# Patient Record
Sex: Female | Born: 1955 | Race: White | Hispanic: No | Marital: Married | Smoking: Former smoker
Health system: Southern US, Community
[De-identification: ages and names within clinical notes are randomized; demographics above are authoritative.]

## PROBLEM LIST (undated history)

## (undated) DIAGNOSIS — I219 Acute myocardial infarction, unspecified: Secondary | ICD-10-CM

## (undated) DIAGNOSIS — Z9889 Other specified postprocedural states: Secondary | ICD-10-CM

## (undated) DIAGNOSIS — I10 Essential (primary) hypertension: Secondary | ICD-10-CM

## (undated) DIAGNOSIS — I5181 Takotsubo syndrome: Secondary | ICD-10-CM

## (undated) DIAGNOSIS — R112 Nausea with vomiting, unspecified: Secondary | ICD-10-CM

## (undated) HISTORY — PX: UTERINE FIBROID SURGERY: SHX826

## (undated) HISTORY — DX: Essential (primary) hypertension: I10

## (undated) HISTORY — DX: Takotsubo syndrome: I51.81

## (undated) HISTORY — DX: Acute myocardial infarction, unspecified: I21.9

---

## 1997-09-14 HISTORY — PX: ABDOMINAL HYSTERECTOMY: SHX81

## 1998-11-12 ENCOUNTER — Other Ambulatory Visit: Admission: RE | Admit: 1998-11-12 | Discharge: 1998-11-12 | Payer: Self-pay | Admitting: Obstetrics and Gynecology

## 2005-05-01 ENCOUNTER — Inpatient Hospital Stay (HOSPITAL_COMMUNITY): Admission: EM | Admit: 2005-05-01 | Discharge: 2005-05-05 | Payer: Self-pay | Admitting: Emergency Medicine

## 2005-05-01 ENCOUNTER — Ambulatory Visit: Payer: Self-pay | Admitting: Cardiology

## 2005-05-04 ENCOUNTER — Encounter: Payer: Self-pay | Admitting: Cardiology

## 2005-06-05 ENCOUNTER — Ambulatory Visit: Payer: Self-pay | Admitting: Cardiology

## 2005-06-30 ENCOUNTER — Ambulatory Visit: Payer: Self-pay | Admitting: Cardiology

## 2005-08-17 ENCOUNTER — Ambulatory Visit: Payer: Self-pay | Admitting: Cardiology

## 2005-09-11 ENCOUNTER — Ambulatory Visit: Payer: Self-pay

## 2005-09-11 ENCOUNTER — Encounter: Payer: Self-pay | Admitting: Cardiology

## 2006-03-19 ENCOUNTER — Ambulatory Visit: Payer: Self-pay | Admitting: Cardiology

## 2006-06-28 ENCOUNTER — Ambulatory Visit: Payer: Self-pay | Admitting: Cardiology

## 2006-07-11 IMAGING — CR DG CHEST 1V PORT
1 series · 1 of 1 positions shown · non-contrast
Comparison: None.

CLINICAL DATA: Chest pain. Hypertension. Ex-smoker.

PORTABLE CHEST - 1 VIEW

[view not recorded]
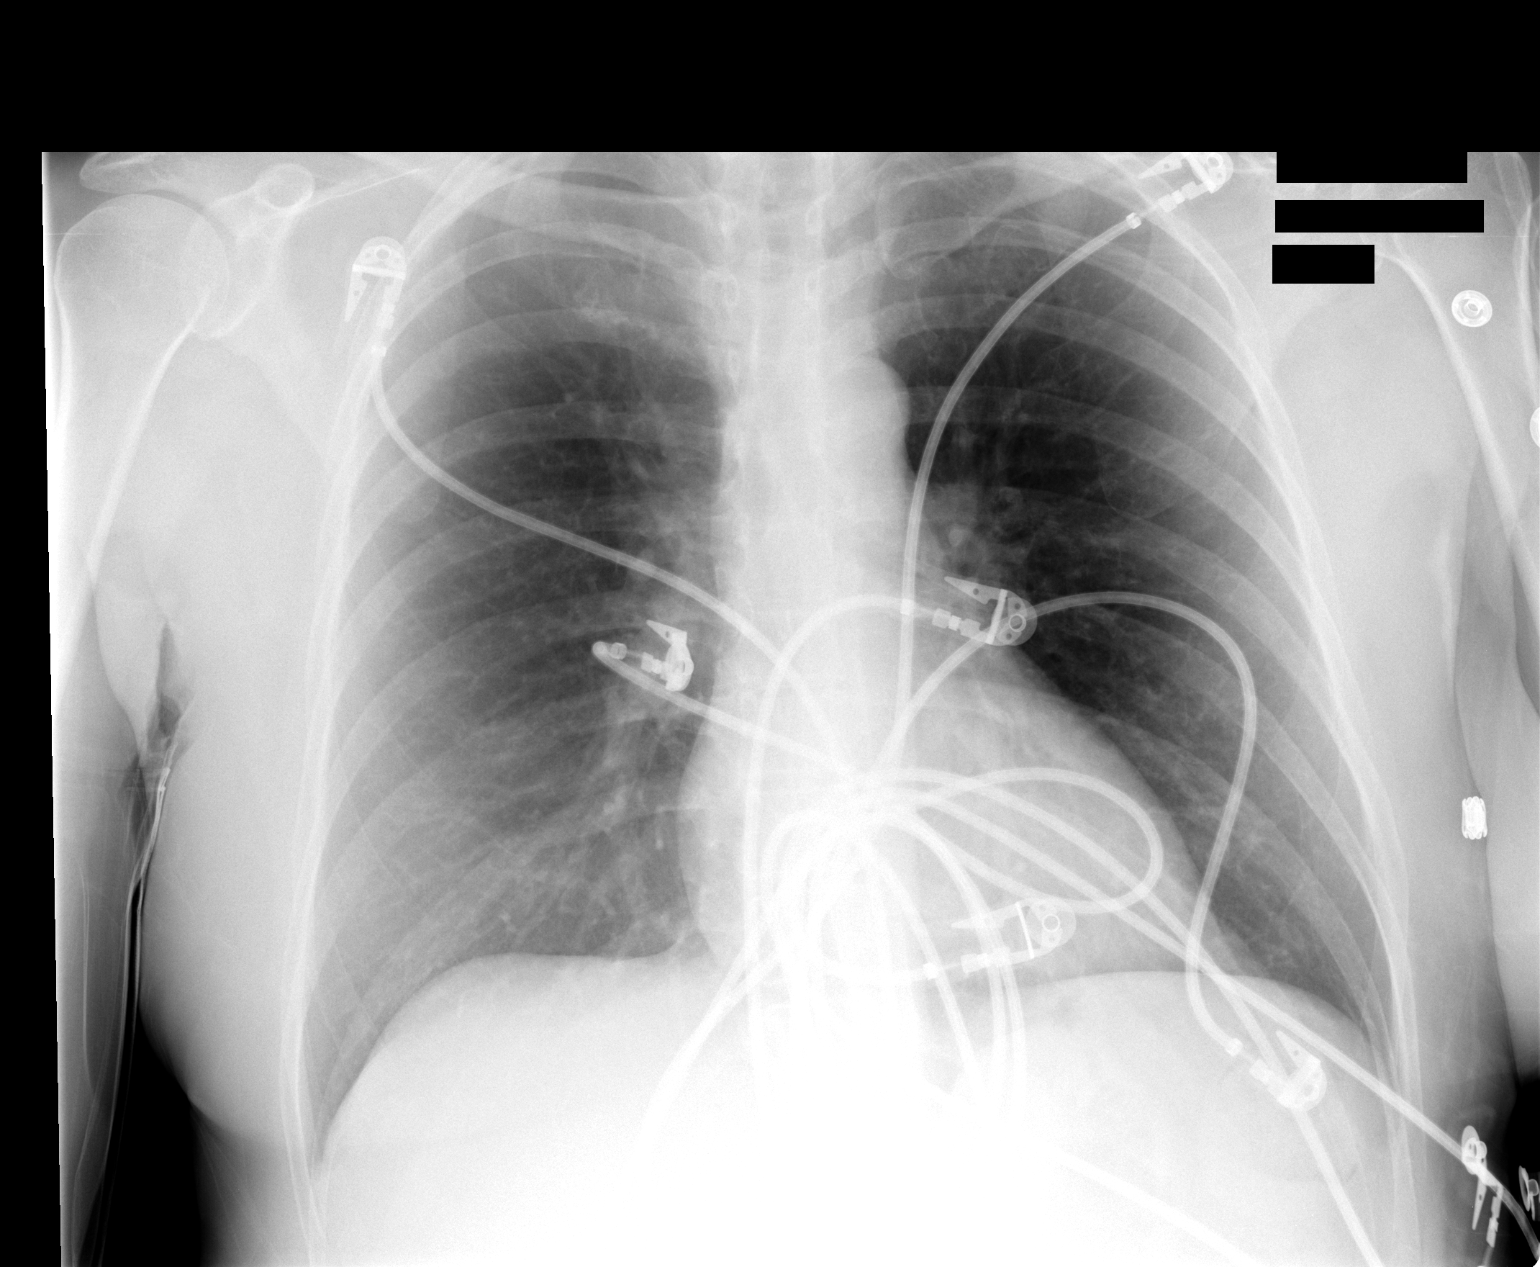

[1 of 1 positions shown; findings below may reference images not displayed]

FINDINGS: Normal sized heart. No changes of COPD. Clear lungs. Unremarkable
bones.  

IMPRESSION

Bile changes of COPD. No acute abnormality.

## 2006-10-25 ENCOUNTER — Ambulatory Visit: Payer: Self-pay | Admitting: Cardiology

## 2006-10-25 LAB — CONVERTED CEMR LAB
AST: 22 units/L (ref 0–37)
Albumin: 4 g/dL (ref 3.5–5.2)
LDL Cholesterol: 104 mg/dL — ABNORMAL HIGH (ref 0–99)
Total Bilirubin: 0.6 mg/dL (ref 0.3–1.2)
Total Protein: 6.6 g/dL (ref 6.0–8.3)

## 2006-11-04 ENCOUNTER — Ambulatory Visit: Payer: Self-pay | Admitting: Cardiology

## 2007-11-04 ENCOUNTER — Ambulatory Visit: Payer: Self-pay | Admitting: Cardiology

## 2008-01-11 ENCOUNTER — Other Ambulatory Visit: Admission: RE | Admit: 2008-01-11 | Discharge: 2008-01-11 | Payer: Self-pay | Admitting: Family Medicine

## 2008-11-09 ENCOUNTER — Ambulatory Visit: Payer: Self-pay | Admitting: Cardiology

## 2008-11-16 ENCOUNTER — Ambulatory Visit: Payer: Self-pay

## 2008-11-16 ENCOUNTER — Encounter: Payer: Self-pay | Admitting: Cardiology

## 2009-02-04 ENCOUNTER — Encounter: Payer: Self-pay | Admitting: Cardiology

## 2009-10-11 ENCOUNTER — Encounter (INDEPENDENT_AMBULATORY_CARE_PROVIDER_SITE_OTHER): Payer: Self-pay | Admitting: *Deleted

## 2010-10-14 NOTE — Letter (Signed)
Summary: Appointment - Reminder 2  [employer], Main Office  [address]   [place], [place] [address]   Phone: [phone]  Fax: [phone]     October 11, 2009 MRN: [id number]   Janice [name] [address] [place], [place]  [address]   Dear Ms. Weidinger,  Our records indicate that it is time to schedule a follow-up appointment with Dr. Myrtis Ser. It is very important that we reach you to schedule this appointment. We look forward to participating in your health care needs. Please contact us at the number listed above at your earliest convenience to schedule your appointment.  If you are unable to make an appointment at this time, give [place] a call so we can update our records.  Sincerely,   Migdalia Dk [facility] Scheduling Team

## 2011-01-27 NOTE — Assessment & Plan Note (Signed)
[facility]                            CARDIOLOGY OFFICE NOTE   NAME:Hennigan, JOSELLE DEEDS                     MRN:          [id number]  DATE:11/04/2007                            DOB:          November 21, 1955    Ms. Vetsch is doing very well.  The patient had an episode of with  chest pain and Tako-Tsubo cardiomyopathy.  Cath showed slight disease in  a very small right coronary branch.  Her LV function returned completely  to normal.  I stopped her Coreg on that basis.  She remains on  lisinopril.  She is doing very well.  She has improved her lifestyle and  has lost 20 pounds.  She is going about full activities.  She is not  having any chest pain.   PAST MEDICAL HISTORY:   ALLERGIES:  No known drug allergies.   MEDICATIONS:  Multivitamin, lisinopril, Premarin, fish oil, vitamin C.   OTHER MEDICAL PROBLEMS:  See the list below.   REVIEW OF SYSTEMS:  She is doing well and a review of systems is  negative.   PHYSICAL EXAM:  Weight is 150 pounds on our scale.  Blood pressure is  130/66 with a pulse of 68.  The patient is oriented to person, time and place.  Affect is normal.  HEENT:  Reveals no xanthelasma.  She has normal extraocular motion.  Lungs are clear.  Respiratory effort is not labored.  Cardiac exam reveals S1-S2.  There are no clicks or significant murmurs.  Abdomen is soft.  She has no peripheral edema.   EKG shows mild left axis.  There are no marked abnormalities.   PROBLEMS:  1. Trivial coronary disease.  She does not tolerate taking a statin.      We will not push towards statin therapy but her lifestyle      modifications have been excellent.  2. Hypertension excellent control next episode of Tako-Tsubo      cardiomyopathy from which she has improved completely.  3. History of a fractured left ankle.  4. History of significant constipation historically.   She is doing well.  No change in her meds.  I will see her back in the  year.     Luis Abed, MD, [facility]  Electronically Signed    JDK/MedQ  DD: 11/04/2007  DT: 11/05/2007  Job #: [id number]   cc:   Deboraha Sprang at [facility]

## 2011-01-27 NOTE — Assessment & Plan Note (Signed)
[facility]                            CARDIOLOGY OFFICE NOTE   NAME:Janice [name], Janice [name]                     MRN:          [id number]  DATE:11/09/2008                            DOB:          [birth year]    Janice [name] is quite stable.  Several years ago, she had a takotsubo  event.  She developed left ventricular dysfunction, which immediately  improved to normal.  Cardiac catheterization was done at that time.  She  had very slight disease and a small right coronary artery.  After her LV  returned completely to normal, her meds were stopped.  She had been on  lisinopril and carvedilol.  She is no longer on these.  She has no  significant symptoms.   PAST MEDICAL HISTORY:   ALLERGIES:  No known drug allergies.   MEDICATIONS:  Multivitamin, fish oil, vitamin C, Premarin, and  amlodipine.   OTHER MEDICAL PROBLEMS:  See the list below.   REVIEW OF SYSTEMS:  She has no GI or GU complaints.  There are no fevers  or chills.  She has no skin rashes.  Her review of systems is negative.   PHYSICAL EXAMINATION:  VITAL SIGNS:  Blood pressure is 137/74 with pulse  of 75.  GENERAL:  The patient is oriented to person, time, and place.  Affect is  normal.  HEENT:  No xanthelasma.  She has normal extraocular motion.  There are  no carotid bruits.  There is no jugular venous distention.  LUNGS:  Clear.  Respiratory effort is not labored.  CARDIAC:  An S1-S2.  There are no clicks or significant murmurs.  ABDOMEN:  Soft.  She has no peripheral edema.   EKG shows no change.  There is old left axis.   PROBLEMS:  1. 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  2. Hypertension, controlled.  3. Episode of takotsubo cardiomyopathy with complete resolution and      return to normal left ventricular function.   The patient is quite stable.  I have decided to proceed with the  followup 2-D echo to be sure that her LV function has remained normal.  This will be done and will be in touch with her.     Luis Abed, MD, [facility]  Electronically Signed    JDK/MedQ  DD: 11/09/2008  DT: 11/10/2008  Job #: [id number]   cc:   Dossie Der, MD

## 2011-01-30 NOTE — Discharge Summary (Signed)
Janice [name], Janice [name] NO.:  [id number]   MEDICAL RECORD NO.:  [id number]          PATIENT TYPE:  INP   LOCATION:  4709                         FACILITY:  [facility]   PHYSICIAN:  Arvilla Meres, M.D. LHCDATE OF BIRTH:  Oct 27, 1955   DATE OF ADMISSION:  05/01/2005  DATE OF DISCHARGE:  05/05/2005                           DISCHARGE SUMMARY - REFERRING   SUMMARY OF HISTORY:  Janice [name] is a 55 year old white female who on  April 30, 2005 at approximately 2 p.m., after getting her hair cut, stood  up out of a chair and developed sudden onset of 8/10 substernal chest  heaviness radiating through to her upper mid-back associated with  diaphoresis.  On return home, she took an aspirin with some relief; however,  at 11 p.m., it increased again.  She took another aspirin and was eventually  able to fall asleep.  At approximately 5 a.m., she was awakened again with  similar symptoms and thus presented to the emergency room.  She was given 4  sublingual nitroglycerin as well as aspirin with complete relief of her  discomfort.  Enzymes were positive for myocardial infarction and EKG showed  nonspecific changes, thus she was admitted for further evaluation.   Her history is notable for hypertension, unknown lipid status, remote  tobacco use.   LABORATORY DATA:  Chest x-ray shows no acute abnormality.   Hemoglobin and hematocrit were 14.6 and 42.1, normal indices, platelets of  334,000, WBC was 12.5 on admission.  Subsequent hematologies were  unremarkable.  Admission PTT was greater than 200, PT 14.4 with an INR of  1.1.  Admission sodium was 136, potassium 3.0, BUN 11, creatinine 0.7,  glucose 129.  Chemistry on the 19th showed a potassium of 3.1, normal LFTs.  Potassium on the 20th was 4.1.  Last chemistry performed on the 21st was 141  and showed a sodium of 141, potassium 3.6, BUN 7, creatinine 0.7.  Initial  CK total/MB was 366 and 23.5 with a 6.4 relative index.   Troponin was 4.75.  Subsequent CK-MB relative index and troponin were declining.  Fasting lipids  showed a total cholesterol of 152, triglycerides of 222, HDL 37, LDL 71.  TSH was 4.084.   Echocardiogram performed on May 04, 2005 showed an ejection fraction of  50% to 55% without wall motion abnormalities.  There was flat coaptation of  the mitral valve without definite mitral valve prolapse.   EKG on admission showed normal sinus rhythm, left axis deviation, slightly  delayed R wave, T wave inversion in V5 and V6.  Subsequent EKGs showed  normal sinus rhythm, diffuse T wave inversion.   HOSPITAL COURSE:  Janice [name] went directly to the cardiac catheterization  laboratory.  Dr. Samule Ohm performed cardiac catheterization.  Initial EF was  35% with apical ballooning.  She did not have any coronary artery disease.  Dr. Samule Ohm felt that she had Takotsubo's cardiomyopathy and we will treat  with ACE inhibitor, Coreg and anticoagulate.  There was noticed to be a tiny  branch of the RCA which appeared to be full, filled via left-to-right  collaterals; this  is not, however, large enough to explain the LV  dysfunction, thus he favored the diagnosis of Takotsubo cardiomyopathy.  She  continued on heparin and over the next several days, her medications were  gradually adjusted.  Lipitor was started for her hyperlipidemia.  Dr.  Diona Browner initially felt that she should not be on Premarin.  Cardiac Rehab  assisted with ambulation and education.  She was transferred to Telemetry,  where her activity increased without difficulty.  Discharge planning was  begun.  Case Management also assisted with education and discharge needs.  It was noted that she had some diarrhea on the 22nd; it was treated with  Zofran and Imodium.  On May 05, 2005, after Dr. Gala Romney assessed the  patient, he had reviewed the echocardiogram and it was noted to show an EF  of approximately 50% to 55%.  He felt that she  could be discharged home on  her current medical regimen and follow up with Dr. Myrtis Ser in approximately 3-4  weeks.  If she continued to have diarrhea, she was asked to contact her  primary care physician.   DISCHARGE DIAGNOSES:  1.  Non-Q myocardial infarction.  2.  Takotsubo cardiomyopathy with improved left ventricular function prior      to discharge.  3.  Hypokalemia.  4.  Hyperlipidemia.  5.  Hypertension.  6.  History as previously.   DISPOSITION:  Janice [name] was discharged home.   DIET:  She was asked to maintain a low-salt/-fat/-cholesterol diet.   ACTIVITY:  Her activities were restricted for approximately 2 weeks in  regard to driving, lifting, sexual activity.  She was advised to increase  her activity slowly and if she is feeling up to it, may return to work at  the end of 2 weeks.   MEDICATIONS:  1.  Premarin 0.625 mg daily (Dr. Myrtis Ser instructed at the time of discharge      she may continue this).  2.  Aspirin 325 mg daily.  3.  Lisinopril 5 mg daily.  4.  Plavix 75 mg daily.  5.  Coreg 6.25 mg b.i.d.  6.  Lipitor 40 mg nightly.  7.  Nitroglycerin 0.4 mg as needed.   FOLLOWUP:  She will follow up with Dr. Henrietta Hoover physician assistant, Joellyn Rued, PA-C, on a day in the office that Dr. Myrtis Ser is there.  This is  scheduled for June 01, 2005 at 10 a.m.  She is also asked to arrange a  followup with Dr. Janey Greaser for her general medical needs and especially if  her diarrhea persists.  She was instructed not to take her atenolol or  chlorthalidone.  At the time of followup with Dr. Myrtis Ser, blood work in  regards to fasting lipids  and LFTs should be arranged in approximately 6-8 weeks since Lipitor was  initiated.  Given her diagnosis of Takotsubo cardiomyopathy, an  echocardiogram should also be performed in approximately 3 months to further  evaluate her ejection fraction.      Joellyn Rued, P.A. [facility]      Arvilla Meres, M.D. [facility] Electronically  Signed    EW/MEDQ  D:  05/05/2005  T:  05/05/2005  Job:  [id number]   cc:   Willa Rough, M.D.  [address]  [address]  [place]  [place] [address]   Al Decant. Janey Greaser, MD  [address]  [place]  [place] [address]  Fax: [phone]

## 2011-01-30 NOTE — Assessment & Plan Note (Signed)
[facility]                            CARDIOLOGY OFFICE NOTE   NAME:[name], Janice MAIORINO                     MRN:          [id number]  DATE:01/12/2008                            DOB:          1956/07/24    I received a phone call today from Dr. Camillo Flaming concerning [name].  The patient had said that she could never receive any kind of  epinephrine, and Dr. Zachery Dauer was asking about this.  I explained that we  would like for this to be limited to force on any elective basis such as  dental work.  This is because she has had a stress cardiomyopathy.  However, certainly if she were to have anaphylaxis and need epinephrine,  that would be appropriate.  Dr. Zachery Dauer also asked me about blood  pressure medicines.  I told her that any standard blood pressure  medicine could be used.  We may want to keep away from beta blockers.     Luis Abed, MD, [facility]  Electronically Signed    JDK/MedQ  DD: 01/12/2008  DT: 01/12/2008  Job #: [id number]

## 2011-01-30 NOTE — Assessment & Plan Note (Signed)
[facility]                              CARDIOLOGY OFFICE NOTE   NAME:[name], Janice ROESLER                     MRN:          [id number]  DATE:06/28/2006                            DOB:          [birth year]    Janice [name] is seen for followup.  She is doing very well.  She had non-ST  elevation MI that was probably due to Takat Subo cardiomyopathy.  She did  have slight disease of a very small right coronary artery branch.  She had  very rapid return of LV function.  She had a follow-up echo on September 11, 2005 showing that her ejection fraction was up to 55-65% with no regional  wall motion abnormalities.  She is not having any chest pain.  She has no  significant shortness of breath.  She is bothered by leg discomfort.  It is  not exertional.  She can have pain from her waist down.  She also can have  some burning in her feet at night.  She is going about full activities.  She  has no significant __________.   PAST MEDICAL HISTORY:  See the list below.   MEDICATIONS:  1. Lisinopril 5.  2. Coreg 6.25 b.i.d. (to be stopped).  3. Multivitamin.  4. Aspirin 81.  5. Premarin 3 days per week.   ALLERGIES:  No known drug allergies.   REVIEW OF SYSTEMS:  She mentioned, as I noted above, the discomfort in her  legs that she has.  She has mentioned this to Janice [name], and at this  point, there are no definite answers.  I do not believe that it is vascular.  The remainder of her review of systems is negative.   PHYSICAL EXAMINATION:  VITAL SIGNS:  Blood pressure is 122/80 with a pulse  of 58.  Patient weighs 158 pounds.  GENERAL:  She is well-developed and well-nourished.  Patient is oriented to  person, time, and place, and her affect is normal.  NECK:  Carotids reveal no bruits.  There is no jugular venous distention.  She has no xanthelasma.  There is normal extraocular motion.  LUNGS:  Clear.  Respiratory effort is not labored.  CARDIAC:  An S1  with an S2.  There are no clicks or significant murmurs.  ABDOMEN:  Soft.  There are no masses or bruits.  There is no peripheral  edema.  There are 2+ distal pulses.  There are no musculoskeletal  deformities.   An EKG reveals sinus rhythm with no significant change.   PROBLEMS:  1. Trivial coronary disease at the time of her catheterization with very      small right coronary artery branch with retrograde flow.  I would like      to use a statin; however, the patient may not be tolerating a statin at      this time, although her leg discomfort continues even off the Lipitor.      She very much wants to stay off Lipitor.  We will keep her off the      statin at this time.  Her lipids at baseline are actually normal.  In      July, 2007, she had an LDL of 87 with an HDL of 42 and triglycerides of      48 on no medications.  We will follow her overall status and not      restart a statin at this time.  2. Hypertension:  This is stable at this point, and we will follow up her      blood pressure after she is off Coreg.  3. Episode of Takotsubo cardiomyopathy:  She is greatly improved.  We will      continue to follow her.  4. Bilateral leg discomfort, the exact etiology is not clear to me.  I      believe it is not cardiac in origin.  She has good pulses, and it is      nonexertional.   We are therefore changing her medicines.  She will remain off Lipitor.  Coreg will be stopped.            ______________________________  Luis Abed, MD, [facility]     JDK/MedQ  DD:  06/28/2006  DT:  06/29/2006  Job #:  [id number]   cc:   Janice [name]. Janey Greaser, MD

## 2011-01-30 NOTE — Cardiovascular Report (Signed)
[name], Janice [name] NO.:  [id number]   MEDICAL RECORD NO.:  [id number]          PATIENT TYPE:  INP   LOCATION:  1823                         FACILITY:  [facility]   PHYSICIAN:  Salvadore Farber, M.D. LHCDATE OF BIRTH:  02/25/1956   DATE OF PROCEDURE:  05/01/2005  DATE OF DISCHARGE:                              CARDIAC CATHETERIZATION   PROCEDURE:  Left heart catheterization, left ventriculography, coronary  angiography.   INDICATIONS:  [name] is a 55 year old lady with cardiac risk factors  of hypertension and very distant tobacco abuse who presents with  approximately 14 hours of chest discomfort.  She was found to have elevated  troponin.  Electrocardiogram demonstrated T wave inversions in V5 and V6  with T wave flattening in leads I and aVL.  She was referred for diagnostic  angiography and possible percutaneous coronary intervention.   PROCEDURAL TECHNIQUE:  Informed consent was obtained.  Under 1% lidocaine  local anesthesia, a 5-French sheath was placed in the right common femoral  artery using the modified Seldinger technique.  Diagnostic angiography and  ventriculography were  performed using JL-4, JR-4 and pigtail catheters.  The patient tolerated the procedure well and was transferred to the holding  room in stable condition.  Sheaths were  removed there.   COMPLICATIONS:  None.   FINDINGS:  1.  Left ventricle:  128/1/11.  Ejection fraction approximately 35% with      normal contraction at the base of the heart but apical ballooning.      There is no aortic stenosis or mitral regurgitation.  2.  Left main:  Angiographically normal.  3.  Left anterior descending:  Moderate size vessel just reaching the apex      of the heart and giving rise to two diagonals.  The distal vessel is      small but without focal abnormality.  4.  Circumflex:  Moderate size vessel giving rise to a single obtuse      marginal.  It is angiographically normal.  5.   Right coronary artery:  Large, dominant vessel which wraps the apex of      the heart.  There is a tiny branch of the RCA which appears to fill the      left to right collaterals.  Though this is seen on imaging with      injection of the left, no occluded branches are seen on angiography of      the RCA.   IMPRESSION/RECOMMENDATION:  The picture is most consistent with Tako-Tsubo  cardiomyopathy.  I think the small branch of the right coronary artery that  appears to fill collaterals is clearly not sufficient to explain the extent  of left ventricular dysfunction on its down.  Instead, I think that that is  unrelated.  Will treat her cardiomyopathy with ACE inhibitor and carvedilol.  Will anticoagulate with heparin and follow with Coumadin.      Salvadore Farber, M.D. [facility]  Electronically Signed     WED/MEDQ  D:  05/01/2005  T:  05/01/2005  Job:  [id number]   cc:   Tinnie Gens  Myrtis Ser, M.D.  [address]  [address]  [place]  [place] [address]   Al Decant. Janey Greaser, MD  [address]  [place]  [place] [address]  Fax: [phone]

## 2011-01-30 NOTE — H&P (Signed)
NAMEMEGHANNE, PLETZ NO.:  [id number]   MEDICAL RECORD NO.:  [id number]          PATIENT TYPE:  INP   LOCATION:  1823                         FACILITY:  [facility]   PHYSICIAN:  Willa Rough, M.D.     DATE OF BIRTH:  20-Dec-1955   DATE OF ADMISSION:  05/01/2005  DATE OF DISCHARGE:                                HISTORY & PHYSICAL   PRIMARY CARE PHYSICIANS:  Primary care:  Al Decant. Janey Greaser, M.D.;  Cardiologist (new): Willa Rough, M.D.   PATIENT PROFILE:  55 year old white female with no prior history of coronary  artery disease who presents with non-ST elevation myocardial infarction.   PROBLEM LIST:  1.  Non-ST elevation myocardial infarction.  2.  Hypertension.  3.  Remote tobacco abuse, quit 20 years ago.  4.  Status post total abdominal hysterectomy, bilateral salpingo-      oophorectomy in 1999 with hormone replacement since.   HISTORY OF PRESENT ILLNESS:  54 year old white female with no prior history  of coronary artery disease who was in her usual state of health until  approximately 2 P.M. on April 30, 2005 when after getting a hair cut she  stood up out of the chair and developed sudden onset of 8/10 substernal  chest heaviness radiating through to her upper/mid back with diaphoresis.  She went home and took an aspirin 81 mg tablet at approximately 4 P.M. with  some relief in pain but not completely.  By 11:00 P.M. her chest pain had  increased again to 8/10 and she took another baby aspirin and was able to  fall off to sleep.  She awoke at approximately 5 A.M. with 9/10 substernal  chest heaviness, again with diaphoresis and presented to the [facility]  emergency department at 6:36 A.M. and was promptly treated with full  strength aspirin and four sublingual nitroglycerin tablets with complete  relief of discomfort.  She was given a heparin bolus 5,000 units at 8:00  A.M. and her first set of markers show CK-MB of 8.8 and troponin-I of 0.99  with  second set showing MB of 12.7 and troponin-I of 1.3.  electrocardiogram  shows nonspecific ST-T abnormalities.  She is currently pain-free.  She was  given 600 mg of oral Plavix at 8:40 this morning with plans to go to the  catheterization laboratory later today.   ALLERGIES:  No known drug allergies.   HOME MEDICATIONS:  1.  Premarin 0.625 mg daily.  2.  Atenolol/chlorthalidone 25 mg daily.   FAMILY HISTORY:  Mother died of chronic obstructive pulmonary disease at age  58.  Father is age 30 and had a myocardial infarction in his 24's.  She has  one sister who is alive and well.   SOCIAL HISTORY:  She lives in [place] with her husband.  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  She is married with two children. She  previously smoked 6 to 7 years, one pack a day, and quit 20 years ago.  She  has one glass of wine a night. She denies any drug use.  She just started  exercising/walking about three weeks ago and performing Yoga.   REVIEW OF SYSTEMS:  Positive for chest pain and diaphoresis.  All other  systems reviewed and negative.   PHYSICAL EXAMINATION:  VITAL SIGNS:  Temperature 98.2, heart rate 79,  respirations 14, blood pressure 152/91, pulse ox is 100% on room air.  GENERAL APPEARANCE:  Pleasant white female in no acute distress.  She is  awake, alert and oriented X3.  NECK:  Normal carotid upstrokes.  No bruits or jugular venous distention.  LUNGS:  Respirations are regular and unlabored. Clear to auscultation.  CARDIAC:  Regular S1, S2, no S3, S4 or murmurs.  ABDOMEN:  Round, soft, nontender, nondistended.  Bowel sounds present X4.  EXTREMITIES:  Warm, dry, pink, no cyanosis, clubbing or edema.  Dorsalis  pedis pulse and posterior tibial pulses are 2+ and equal bilaterally.  Femoral pulses are 2+ and equal without any bruits.   CLINICAL DATA:  Chest x-ray shows no acute disease.  Electrocardiogram shows  sinus rhythm with a rate of 73.  Nonspecific ST-T  changes.   LABORATORY DATA:  Hemoglobin 16.0, hematocrit 47.0, sodium 136, potassium  3.1, chloride 100, cO2 22, BUN 9, creatinine 0.8, glucose 132.  CK-MB 8.8,  increasing to 12.7, troponin-I 0.99 increasing to 1.3.   ASSESSMENT/PLAN:  1.  Non-ST elevation myocardial infarction, substernal chest heaviness since      2:00 P.M. on April 30, 2005 now relieved with four sublingual      nitroglycerin in the emergency department.  Electrocardiogram without      acute changes and her MB and troponin have now elevated to 12.7 and 1.3.      She has received aspirin and heparin and we have given 600 mg of Plavix.      We will also initiate intravenous nitroglycerin.  Will continue beta      blocker, add Statin and ACE inhibitor and plan an admission to CCU bed.      We have discussed her case with the catheterization laboratory and plan      on getting her up there some time this morning.  2.  Hypertension.  Blood pressure is elevated.  Will continue beta blocker      and add ACE inhibitor and titrate as necessary.  3.  Lipid status is currently unknown, will check fasting lipid profile and      liver function tests.  Will add Lipitor 80 mg q.h.s.  4.  Hypokalemia.  Her potassium was 3.1 on admission.  She has had potassium      added to her intravenous fluids.      Ok Anis, NP    ______________________________  Willa Rough, M.D.    CRB/MEDQ  D:  05/01/2005  T:  05/01/2005  Job:  [id number]

## 2011-01-30 NOTE — Assessment & Plan Note (Signed)
[facility]                            CARDIOLOGY OFFICE NOTE   NAME:Henly, JEIMY BICKERT                     MRN:          [id number]  DATE:11/04/2006                            DOB:          09/09/1956    Ms. Pieratt is doing very well.  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  She had slight disease of  a very small right coronary branch.  She had rapid return of normal LV  function.  Her last ejection fraction was in the 55-65% range.  On that  basis, we stopped her Coreg.  She does have hypertension and her  lisinopril was recently adjusted upwards by Dr. Janey Greaser.  She is not  having chest pain.  She has no syncope.  She has had no significant  shortness of breath.   Patient did fall and fractured her left ankle.  This is improving.  This  was not a syncopal episode.   PAST MEDICAL HISTORY:  ALLERGIES:  NO KNOWN DRUG ALLERGIES.   MEDICATION:  1. Multivitamin.  2. Aspirin 81 mg, now to be taken every 3 days.  3. Lisinopril 20 mg daily.  4. Premarin.   OTHER MEDICAL PROBLEMS:  See the list below.   REVIEW OF SYSTEMS:  Patient has significant constipation.  She feels  that aspirin makes this worse.  I did encourage her to try to take one  very small aspirin every 3 days because of the minimal abnormality in  one of her right coronary artery branches.  If she cannot tolerate this,  I do not feel that we need to push harder.  Otherwise, her review of  systems is negative.   PHYSICAL EXAM:  Weight today is 168 pounds.  She is gaining some weight  and she needs to lose this.  Blood pressure is 143/85 with pulse of  71.  Patient is oriented to person, time and place.  Affect is normal.  There  is no xanthelasma.  She has normal extraocular motion.  She has no  carotid bruits.  There is no jugular venous distention.  LUNGS:  Clear.  Respiratory effort is not labored.  CARDIAC:  An S1 with an S2.   There are no clicks or significant murmurs.  ABDOMEN:  Soft.  There are no masses or bruits.  She has no peripheral edema.  There are 2+ distal pulses.  She has no  musculoskeletal deformity.   EKG reveals mild left axis.   PROBLEMS:  1. 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  A Statin      would be optimal, but she does not tolerate them.  Also, her lipids      really are quite good without a medication.  With this in mind, she      and I had a significant discussion and we have both agreed that we      would not push to have her on a Statin at this time.  Following her      lipids over time, of course, will be appropriate.  2. Hypertension.  Dr. Janey Greaser is adjusting her medicines and this, of      course, is very important.  3. Episode of tako-tsubo cardiomyopathy.  The patient is greatly      improved.  Will plan to consider checking an echocardiogram in      another year.  4. Fracture to her left ankle.  This is improving.  5. Significant constipation.  As mentioned, will use the lowest dose      of aspirin possible.   I had an extended discussion with the patient about tako-tsubo  cardiomyopathy.  It is not known if patients have any significant chance  of recurrence.  Historically, people have this problem if they have a  severe episode of stress, which really did not happen to her.  I told  her that we encourage full activity and that at this point we have no  reason to believe that she will have a recurrence.  Will follow her  carefully over time.     Luis Abed, MD, [facility]  Electronically Signed    JDK/MedQ  DD: 11/04/2006  DT: 11/04/2006  Job #: [id number]   cc:   Al Decant. Janey Greaser, MD

## 2012-05-23 ENCOUNTER — Other Ambulatory Visit: Payer: Self-pay

## 2012-05-26 ENCOUNTER — Ambulatory Visit (INDEPENDENT_AMBULATORY_CARE_PROVIDER_SITE_OTHER): Payer: Self-pay | Admitting: Surgery

## 2012-06-17 ENCOUNTER — Encounter (INDEPENDENT_AMBULATORY_CARE_PROVIDER_SITE_OTHER): Payer: Self-pay | Admitting: General Surgery

## 2012-06-17 ENCOUNTER — Ambulatory Visit (INDEPENDENT_AMBULATORY_CARE_PROVIDER_SITE_OTHER): Payer: BC Managed Care – PPO | Admitting: General Surgery

## 2012-06-17 VITALS — BP 134/82 | HR 72 | Temp 97.6°F | Resp 16 | Ht 64.0 in | Wt 143.2 lb

## 2012-06-17 DIAGNOSIS — R920 Mammographic microcalcification found on diagnostic imaging of breast: Secondary | ICD-10-CM

## 2012-06-17 NOTE — Progress Notes (Signed)
Patient ID: Janice [name], female   DOB: 02/08/1956, 56 y.o.   MRN: [id number]  Chief Complaint  Patient presents with  . New Evaluation    Breast    HPI Janice [name] is a 56 y.o. female.  Referred by Dr. Yolanda Bonine HPI This is a 56 year old female who is otherwise healthy. She has no prior history of any breast complaints. She has no complaints now referable to her breasts. She has no family history of breast or ovarian cancer. She underwent a recent mammogram that was screening in nature which showed heterogeneously dense breasts as well as a area of microcalcifications in the retroareolar 6:00 position. This underwent stereotactic core biopsy with hydromorphone tissue marker placement. 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 She comes in today to discuss further therapy. Past Medical History  Diagnosis Date  . Hypertension   . Heart attack     per patient was not due to heart disease or blocked arteries.  . Takotsubo cardiomyopathy     Past Surgical History  Procedure Date  . Abdominal hysterectomy 1999  . Cesarean section     x2  . Uterine fibroid surgery     patient not sure of date was before hysterectomy    Family History  Problem Relation Age of Onset  . COPD Mother   . Cancer Father     lung    Social History History  Substance Use Topics  . Smoking status: Former Smoker    Start date: 06/17/1982  . Smokeless tobacco: Never Used  . Alcohol Use: Yes     glass of wine nightly    Allergies  Allergen Reactions  . Lisinopril Cough    Current Outpatient Prescriptions  Medication Sig Dispense Refill  . amLODipine (NORVASC) 5 MG tablet Daily.      . clobetasol cream (TEMOVATE) 0.05 % Daily.      Marland Kitchen estradiol (ESTRACE) 1 MG tablet Daily.      . fish oil-omega-3 fatty acids 1000 MG capsule Take 1 g by mouth daily.        Review of Systems Review of Systems  Constitutional: Negative for fever,  chills and unexpected weight change.  HENT: Positive for congestion. Negative for hearing loss, sore throat, trouble swallowing and voice change.   Eyes: Negative for visual disturbance.  Respiratory: Positive for cough. Negative for wheezing.   Cardiovascular: Negative for chest pain, palpitations and leg swelling.  Gastrointestinal: Negative for nausea, vomiting, abdominal pain, diarrhea, constipation, blood in stool, abdominal distention and anal bleeding.  Genitourinary: Negative for hematuria, vaginal bleeding and difficulty urinating.  Musculoskeletal: Negative for arthralgias.  Skin: Negative for rash and wound.  Neurological: Negative for seizures, syncope and headaches.  Hematological: Negative for adenopathy. Does not bruise/bleed easily.  Psychiatric/Behavioral: Negative for confusion.    Blood pressure 134/82, pulse 72, temperature 97.6 F (36.4 C), temperature source Temporal, resp. rate 16, height 5\' 4"  (1.626 m), weight 143 lb 4 oz (64.978 kg).  Physical Exam Physical Exam  Vitals reviewed. Constitutional: She appears well-developed and well-nourished.  Cardiovascular: Normal rate, regular rhythm and normal heart sounds.   Pulmonary/Chest: Effort normal and breath sounds normal. She has no wheezes. She has no rales. Right breast exhibits no inverted nipple, no mass, no nipple discharge, no skin change and no tenderness. Left breast exhibits no inverted nipple, no mass, no nipple discharge, no skin change and no tenderness. Breasts are symmetrical.  Lymphadenopathy:  She has no cervical adenopathy.    Data Reviewed mmg and path reviewed  Assessment    Right breast mammographic abnormality, core biopsy with csl    Plan    We discussed indication for excisional biopsy this area being to prove that there is not cancer associated with it. I discussed a wire localized excisional biopsy with her. We discussed the risks associated with this including bleeding, infection,  further surgery.We will get her scheduled as soon as possible.       Tandy Grawe 06/17/2012, 9:53 AM

## 2012-06-17 NOTE — Patient Instructions (Signed)
[facility] °Office Phone Number [phone] ° °BREAST BIOPSY/ PARTIAL MASTECTOMY: POST OP INSTRUCTIONS ° °Always review your discharge instruction sheet given to you by the facility where your surgery was performed. ° °IF YOU HAVE DISABILITY OR FAMILY LEAVE FORMS, YOU MUST BRING THEM TO THE OFFICE FOR PROCESSING.  DO NOT GIVE THEM TO YOUR DOCTOR. ° °1. A prescription for pain medication may be given to you upon discharge.  Take your pain medication as prescribed, if needed.  If narcotic pain medicine is not needed, then you may take acetaminophen (Tylenol), naprosyn (Alleve) or ibuprofen (Advil) as needed. °2. Take your usually prescribed medications unless otherwise directed °3. If you need a refill on your pain medication, please contact your pharmacy.  They will contact our office to request authorization.  Prescriptions will not be filled after 5pm or on week-ends. °4. You should eat very light the first 24 hours after surgery, such as soup, crackers, pudding, etc.  Resume your normal diet the day after surgery. °5. Most patients will experience some swelling and bruising in the breast.  Ice packs and a good support bra will help.  Wear the breast binder provided or a sports bra for 72 hours day and night.  After that wear a sports bra during the day until you return to the office. Swelling and bruising can take several days to resolve.  °6. It is common to experience some constipation if taking pain medication after surgery.  Increasing fluid intake and taking a stool softener will usually help or prevent this problem from occurring.  A mild laxative (Milk of Magnesia or Miralax) should be taken according to package directions if there are no bowel movements after 48 hours. °7. Unless discharge instructions indicate otherwise, you may remove your bandages 48 hours after surgery and you may shower at that time.  You may have steri-strips (small skin tapes) in place directly over the incision.   These strips should be left on the skin for 7-10 days and will come off on their own.  If your surgeon used skin glue on the incision, you may shower in 24 hours.  The glue will flake off over the next 2-3 weeks.  Any sutures or staples will be removed at the office during your follow-up visit. °8. ACTIVITIES:  You may resume regular daily activities (gradually increasing) beginning the next day.  Wearing a good support bra or sports bra minimizes pain and swelling.  You may have sexual intercourse when it is comfortable. °a. You may drive when you no longer are taking prescription pain medication, you can comfortably wear a seatbelt, and you can safely maneuver your car and apply brakes. °b. RETURN TO WORK:  ______________________________________________________________________________________ °9. You should see your doctor in the office for a follow-up appointment approximately two weeks after your surgery.  Your doctor’s nurse will typically make your follow-up appointment when she calls you with your pathology report.  Expect your pathology report 3-4 business days after your surgery.  You may call to check if you do not hear from us after three days. °10. OTHER INSTRUCTIONS: _______________________________________________________________________________________________ _____________________________________________________________________________________________________________________________________ °_____________________________________________________________________________________________________________________________________ °_____________________________________________________________________________________________________________________________________ ° °WHEN TO CALL DR Roselie Cirigliano: °1. Fever over 101.0 °2. Nausea and/or vomiting. °3. Extreme swelling or bruising. °4. Continued bleeding from incision. °5. Increased pain, redness, or drainage from the incision. ° °The clinic staff is available to  answer your questions during regular business hours.  Please don’t hesitate to call and ask to speak to one of the nurses for   clinical concerns.  If you have a medical emergency, go to the nearest emergency room or call 911.  A surgeon from [facility] is always on call at the hospital. ° °For further questions, please visit [email or web address] ° °

## 2012-06-23 ENCOUNTER — Encounter (INDEPENDENT_AMBULATORY_CARE_PROVIDER_SITE_OTHER): Payer: Self-pay

## 2012-06-27 ENCOUNTER — Encounter (HOSPITAL_BASED_OUTPATIENT_CLINIC_OR_DEPARTMENT_OTHER): Payer: Self-pay | Admitting: *Deleted

## 2012-06-27 NOTE — Progress Notes (Signed)
To come in for [facility] [id number]

## 2012-06-29 ENCOUNTER — Encounter (HOSPITAL_BASED_OUTPATIENT_CLINIC_OR_DEPARTMENT_OTHER)
Admission: RE | Admit: 2012-06-29 | Discharge: 2012-06-29 | Disposition: A | Discharge status: Home / Self Care | Payer: BC Managed Care – PPO | Source: Ambulatory Visit | Attending: General Surgery | Admitting: General Surgery

## 2012-06-29 LAB — BASIC METABOLIC PANEL
CO2: 24 mEq/L (ref 19–32)
Calcium: 9.1 mg/dL (ref 8.4–10.5)
Chloride: 102 mEq/L (ref 96–112)
Creatinine, Ser: 0.62 mg/dL (ref 0.50–1.10)
GFR calc Af Amer: >90 mL/min (ref >90)
GFR calc non Af Amer: >90 mL/min (ref >90)
Sodium: 138 mEq/L (ref 135–145)

## 2012-06-29 LAB — CBC WITH DIFFERENTIAL/PLATELET
Basophils Absolute: 0 10*3/uL (ref 0.0–0.1)
HCT: 38.3 % (ref 36.0–46.0)
Lymphocytes Relative: 33 % (ref 12–46)
Lymphs Abs: 2.6 10*3/uL (ref 0.7–4.0)
MCHC: 33.7 g/dL (ref 30.0–36.0)
MCV: 93.2 fL (ref 78.0–100.0)
Monocytes Absolute: 0.4 10*3/uL (ref 0.1–1.0)
Monocytes Relative: 6 % (ref 3–12)
Neutro Abs: 4.6 10*3/uL (ref 1.7–7.7)
RBC: 4.11 MIL/uL (ref 3.87–5.11)
WBC: 7.8 10*3/uL (ref 4.0–10.5)

## 2012-06-29 NOTE — Progress Notes (Signed)
Dr.Crews reviewed EKG - OK for surgery. 

## 2012-07-01 ENCOUNTER — Ambulatory Visit (HOSPITAL_BASED_OUTPATIENT_CLINIC_OR_DEPARTMENT_OTHER): Payer: BC Managed Care – PPO | Admitting: Anesthesiology

## 2012-07-01 ENCOUNTER — Encounter (HOSPITAL_BASED_OUTPATIENT_CLINIC_OR_DEPARTMENT_OTHER): Payer: Self-pay | Admitting: Anesthesiology

## 2012-07-01 ENCOUNTER — Ambulatory Visit (HOSPITAL_BASED_OUTPATIENT_CLINIC_OR_DEPARTMENT_OTHER)
Admission: RE | Admit: 2012-07-01 | Discharge: 2012-07-01 | Disposition: A | Discharge status: Home / Self Care | Payer: BC Managed Care – PPO | Source: Ambulatory Visit | Attending: General Surgery | Admitting: General Surgery

## 2012-07-01 ENCOUNTER — Encounter (HOSPITAL_BASED_OUTPATIENT_CLINIC_OR_DEPARTMENT_OTHER): Payer: Self-pay | Admitting: *Deleted

## 2012-07-01 ENCOUNTER — Encounter (HOSPITAL_BASED_OUTPATIENT_CLINIC_OR_DEPARTMENT_OTHER)
Admission: RE | Disposition: A | Discharge status: Home / Self Care | Payer: Self-pay | Source: Ambulatory Visit | Attending: General Surgery

## 2012-07-01 DIAGNOSIS — N6019 Diffuse cystic mastopathy of unspecified breast: Secondary | ICD-10-CM

## 2012-07-01 DIAGNOSIS — N6089 Other benign mammary dysplasias of unspecified breast: Secondary | ICD-10-CM | POA: Insufficient documentation

## 2012-07-01 DIAGNOSIS — I10 Essential (primary) hypertension: Secondary | ICD-10-CM | POA: Insufficient documentation

## 2012-07-01 HISTORY — DX: Nausea with vomiting, unspecified: R11.2

## 2012-07-01 HISTORY — PX: BREAST BIOPSY: SHX20

## 2012-07-01 HISTORY — DX: Other specified postprocedural states: Z98.890

## 2012-07-01 SURGERY — BREAST BIOPSY WITH NEEDLE LOCALIZATION
Anesthesia: General | Site: Breast | Laterality: Left | Wound class: Clean

## 2012-07-01 MED ORDER — MIDAZOLAM HCL 5 MG/5ML IJ SOLN
INTRAMUSCULAR | Status: DC | PRN
  Administered 2012-07-01: 1 mg via INTRAVENOUS

## 2012-07-01 MED ORDER — BUPIVACAINE HCL (PF) 0.25 % IJ SOLN
INTRAMUSCULAR | Status: DC | PRN
  Administered 2012-07-01: 10 mL

## 2012-07-01 MED ORDER — LACTATED RINGERS IV SOLN
INTRAVENOUS | Status: DC
Start: 2012-07-01 — End: 2012-07-01
  Administered 2012-07-01: 12:00:00 via INTRAVENOUS

## 2012-07-01 MED ORDER — DEXAMETHASONE SODIUM PHOSPHATE 4 MG/ML IJ SOLN
INTRAMUSCULAR | Status: DC | PRN
  Administered 2012-07-01: 10 mg via INTRAVENOUS

## 2012-07-01 MED ORDER — PROPOFOL 10 MG/ML IV BOLUS
INTRAVENOUS | Status: DC | PRN
  Administered 2012-07-01: 160 mg via INTRAVENOUS

## 2012-07-01 MED ORDER — LIDOCAINE HCL (CARDIAC) 20 MG/ML IV SOLN
INTRAVENOUS | Status: DC | PRN
  Administered 2012-07-01: 75 mg via INTRAVENOUS

## 2012-07-01 MED ORDER — ONDANSETRON HCL 4 MG/2ML IJ SOLN: 4.0000 mg | Freq: Four times a day (QID) | INTRAMUSCULAR | Status: DC | PRN

## 2012-07-01 MED ORDER — OXYCODONE-ACETAMINOPHEN 5-325 MG PO TABS: 1.0000 | ORAL_TABLET | ORAL | Status: DC | PRN

## 2012-07-01 MED ORDER — FENTANYL CITRATE 0.05 MG/ML IJ SOLN
INTRAMUSCULAR | Status: DC | PRN
Start: 2012-07-01 — End: 2012-07-01
  Administered 2012-07-01: 25 ug via INTRAVENOUS
  Administered 2012-07-01: 75 ug via INTRAVENOUS
  Administered 2012-07-01: 25 ug via INTRAVENOUS

## 2012-07-01 MED ORDER — HYDROMORPHONE HCL PF 1 MG/ML IJ SOLN
0.2500 mg | INTRAMUSCULAR | Status: DC | PRN
  Administered 2012-07-01 (×2): 0.5 mg via INTRAVENOUS

## 2012-07-01 MED ORDER — OXYCODONE HCL 5 MG/5ML PO SOLN: 5.0000 mg | Freq: Once | ORAL | Status: AC | PRN

## 2012-07-01 MED ORDER — ONDANSETRON HCL 4 MG/2ML IJ SOLN
INTRAMUSCULAR | Status: DC | PRN
  Administered 2012-07-01: 4 mg via INTRAVENOUS

## 2012-07-01 MED ORDER — OXYCODONE HCL 5 MG PO TABS
5.0000 mg | ORAL_TABLET | Freq: Once | ORAL | Status: AC | PRN
  Administered 2012-07-01: 5 mg via ORAL

## 2012-07-01 SURGICAL SUPPLY — 51 items
APPLIER CLIP 9.375 MED OPEN (MISCELLANEOUS) ×2
BENZOIN TINCTURE PRP APPL 2/3 (GAUZE/BANDAGES/DRESSINGS) ×2 IMPLANT
BINDER BREAST LRG (GAUZE/BANDAGES/DRESSINGS) IMPLANT
BINDER BREAST MEDIUM (GAUZE/BANDAGES/DRESSINGS) IMPLANT
BINDER BREAST XLRG (GAUZE/BANDAGES/DRESSINGS) IMPLANT
BINDER BREAST XXLRG (GAUZE/BANDAGES/DRESSINGS) IMPLANT
BLADE SURG 15 STRL LF DISP TIS (BLADE) ×1 IMPLANT
BLADE SURG 15 STRL SS (BLADE) ×1
CANISTER SUCTION 1200CC (MISCELLANEOUS) ×2 IMPLANT
CHLORAPREP W/TINT 26ML (MISCELLANEOUS) ×2 IMPLANT
CLIP APPLIE 9.375 MED OPEN (MISCELLANEOUS) ×1 IMPLANT
CLOTH BEACON ORANGE TIMEOUT ST (SAFETY) ×2 IMPLANT
COVER MAYO STAND STRL (DRAPES) ×2 IMPLANT
COVER TABLE BACK 60X90 (DRAPES) ×2 IMPLANT
DECANTER SPIKE VIAL GLASS SM (MISCELLANEOUS) IMPLANT
DERMABOND ADVANCED (GAUZE/BANDAGES/DRESSINGS)
DERMABOND ADVANCED .7 DNX12 (GAUZE/BANDAGES/DRESSINGS) IMPLANT
DEVICE DUBIN W/COMP PLATE 8390 (MISCELLANEOUS) ×2 IMPLANT
DRAPE PED LAPAROTOMY (DRAPES) ×2 IMPLANT
DRSG TEGADERM 4X4.75 (GAUZE/BANDAGES/DRESSINGS) ×2 IMPLANT
ELECT COATED BLADE 2.86 ST (ELECTRODE) ×2 IMPLANT
ELECT REM PT RETURN 9FT ADLT (ELECTROSURGICAL) ×2
ELECTRODE REM PT RTRN 9FT ADLT (ELECTROSURGICAL) ×1 IMPLANT
GAUZE SPONGE 4X4 12PLY STRL LF (GAUZE/BANDAGES/DRESSINGS) ×2 IMPLANT
GLOVE BIO SURGEON STRL SZ7 (GLOVE) ×2 IMPLANT
GLOVE BIOGEL M STRL SZ7.5 (GLOVE) ×2 IMPLANT
GLOVE BIOGEL PI IND STRL 7.5 (GLOVE) ×1 IMPLANT
GLOVE BIOGEL PI INDICATOR 7.5 (GLOVE) ×1
GOWN PREVENTION PLUS XLARGE (GOWN DISPOSABLE) ×2 IMPLANT
NEEDLE HYPO 25X1 1.5 SAFETY (NEEDLE) ×2 IMPLANT
NS IRRIG 1000ML POUR BTL (IV SOLUTION) ×2 IMPLANT
PACK BASIN DAY SURGERY FS (CUSTOM PROCEDURE TRAY) ×2 IMPLANT
PENCIL BUTTON HOLSTER BLD 10FT (ELECTRODE) ×2 IMPLANT
SLEEVE SCD COMPRESS KNEE MED (MISCELLANEOUS) ×2 IMPLANT
SPONGE LAP 4X18 X RAY DECT (DISPOSABLE) ×2 IMPLANT
STRIP CLOSURE SKIN 1/2X4 (GAUZE/BANDAGES/DRESSINGS) ×2 IMPLANT
SUT MNCRL AB 4-0 PS2 18 (SUTURE) ×2 IMPLANT
SUT MON AB 5-0 PS2 18 (SUTURE) IMPLANT
SUT SILK 2 0 SH (SUTURE) ×2 IMPLANT
SUT VIC AB 2-0 SH 27 (SUTURE) ×1
SUT VIC AB 2-0 SH 27XBRD (SUTURE) ×1 IMPLANT
SUT VIC AB 3-0 SH 27 (SUTURE) ×1
SUT VIC AB 3-0 SH 27X BRD (SUTURE) ×1 IMPLANT
SUT VIC AB 5-0 PS2 18 (SUTURE) IMPLANT
SUT VICRYL AB 3 0 TIES (SUTURE) IMPLANT
SYR CONTROL 10ML LL (SYRINGE) ×2 IMPLANT
TOWEL OR 17X24 6PK STRL BLUE (TOWEL DISPOSABLE) ×2 IMPLANT
TOWEL OR NON WOVEN STRL DISP B (DISPOSABLE) ×2 IMPLANT
TUBE CONNECTING 20X1/4 (TUBING) ×2 IMPLANT
WATER STERILE IRR 1000ML POUR (IV SOLUTION) ×2 IMPLANT
YANKAUER SUCT BULB TIP NO VENT (SUCTIONS) ×2 IMPLANT

## 2012-07-01 NOTE — Anesthesia Procedure Notes (Addendum)
Procedure Name: LMA Insertion Date/Time: 07/01/2012 12:43 PM Performed by: Meyer Russel Pre-anesthesia Checklist: Patient identified, Emergency Drugs available, Suction available and Patient being monitored Patient Re-evaluated:Patient Re-evaluated prior to inductionOxygen Delivery Method: Circle system utilized Preoxygenation: Pre-oxygenation with 100% oxygen Intubation Type: IV induction Ventilation: Mask ventilation without difficulty LMA: LMA inserted LMA Size: 4.0 Number of attempts: 1 Airway Equipment and Method: Bite block Placement Confirmation: breath sounds checked- equal and bilateral Dental Injury: Teeth and Oropharynx as per pre-operative assessment

## 2012-07-01 NOTE — Op Note (Signed)
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 Harden Mo Anesthesia: Gen. With LMA Specimens: Left breast marked short stitch superior, long stitch lateral, double stitch deep Drains: None Estimated blood loss: Minimal Complications: None Sponge and needle count correct x2 at operation Disposition to recovery in stable condition  Indications: This is a 56 year old female who underwent a  screening mammogram with a left breast abnormality. This underwent biopsy and showed a complex sclerosing lesion. She was then referred for excisional biopsy. We discussed a wire localized excisional biopsy and the risks, benefits and indications for that.  Procedure: After informed consent was obtained the patient was first taken to [facility]. She had a wire placed first. I had these mammograms in the operating room. Sequential compression devices were placed on her legs. She was then placed under general anesthesia with an LMA. Her left breast was prepped and draped in the standard sterile surgical fashion. A surgical timeout was then performed.  I made a curvilinear incision in the superior portion of the left breast overlying the wire tract. Cautery was then used to remove the wire and the surrounding tissue in its entirety. A mammogram was then taken to confirm removal of the wire, clip, and calcifications. This was confirmed by radiology. Hemostasis was then obtained. I marked the specimen as above. I then closed the breast with 2-0 Vicryl, 3-0 Vicryl, and 4-0 Monocryl. I infiltrated quarter percent Marcaine throughout this area. I placed Dermabond and Steri-Strips. She tolerated this well was extubated and transferred to recovery stable.

## 2012-07-01 NOTE — Interval H&P Note (Signed)
History and Physical Interval Note:  07/01/2012 12:30 PM  Janice [name]  has presented today for surgery, with the diagnosis of remove left breast tissue  The various methods of treatment have been discussed with the patient and family. After consideration of risks, benefits and other options for treatment, the patient has consented to  Procedure(s) (LRB) with comments: BREAST BIOPSY WITH NEEDLE LOCALIZATION (Left) - left breast wire localization biopsy  as a surgical intervention .  The patient's history has been reviewed, patient examined, no change in status, stable for surgery.  I have reviewed the patient's chart and labs.  Questions were answered to the patient's satisfaction.     Winferd Wease

## 2012-07-01 NOTE — Anesthesia Postprocedure Evaluation (Signed)
Anesthesia Post Note  Patient: Janice [name]  Procedure(s) Performed: Procedure(s) (LRB): BREAST BIOPSY WITH NEEDLE LOCALIZATION (Left)  Anesthesia type: General  Patient location: PACU  Post pain: Pain level controlled and Adequate analgesia  Post assessment: Post-op Vital signs reviewed, Patient's Cardiovascular Status Stable, Respiratory Function Stable, Patent Airway and Pain level controlled  Last Vitals:  Filed Vitals:   07/01/12 1415  BP: 139/67  Pulse: 59  Temp:   Resp: 17    Post vital signs: Reviewed and stable  Level of consciousness: awake, alert  and oriented  Complications: No apparent anesthesia complications

## 2012-07-01 NOTE — Anesthesia Preprocedure Evaluation (Signed)
Anesthesia Evaluation  Patient identified by MRN, date of birth, ID band Patient awake    Reviewed: Allergy & Precautions, H&P , NPO status , Patient's Chart, lab work & pertinent test results  History of Anesthesia Complications (+) PONV  Airway Mallampati: II  Neck ROM: full    Dental   Pulmonary          Cardiovascular hypertension, + Past MI     Neuro/Psych    GI/Hepatic   Endo/Other    Renal/GU      Musculoskeletal   Abdominal   Peds  Hematology   Anesthesia Other Findings   Reproductive/Obstetrics                           Anesthesia Physical Anesthesia Plan  ASA: II  Anesthesia Plan: General   Post-op Pain Management:    Induction: Intravenous  Airway Management Planned: LMA  Additional Equipment:   Intra-op Plan:   Post-operative Plan:   Informed Consent: I have reviewed the patients History and Physical, chart, labs and discussed the procedure including the risks, benefits and alternatives for the proposed anesthesia with the patient or authorized representative who has indicated his/her understanding and acceptance.     Plan Discussed with: CRNA and Surgeon  Anesthesia Plan Comments:         Anesthesia Quick Evaluation

## 2012-07-01 NOTE — Transfer of Care (Signed)
Immediate Anesthesia Transfer of Care Note  Patient: Janice [name]  Procedure(s) Performed: Procedure(s) (LRB) with comments: BREAST BIOPSY WITH NEEDLE LOCALIZATION (Left) - left breast wire localization biopsy   Patient Location: PACU  Anesthesia Type: General  Level of Consciousness: awake, alert  and oriented  Airway & Oxygen Therapy: Patient Spontanous Breathing and Patient connected to face mask oxygen  Post-op Assessment: Report given to PACU RN, Post -op Vital signs reviewed and stable and Patient moving all extremities  Post vital signs: Reviewed and stable  Complications: No apparent anesthesia complications

## 2012-07-01 NOTE — H&P (View-Only) (Signed)
Patient ID: Janice [name], female   DOB: 10/24/1955, 56 y.o.   MRN: [id number]  Chief Complaint  Patient presents with  . New Evaluation    Breast    HPI Janice [name] is a 56 y.o. female.  Referred by Dr. Bertrand HPI This is a 56-year-old female who is otherwise healthy. She has no prior history of any breast complaints. She has no complaints now referable to her breasts. She has no family history of breast or ovarian cancer. She underwent a recent mammogram that was screening in nature which showed heterogeneously dense breasts as well as a area of microcalcifications in the retroareolar 6:00 position. This underwent stereotactic core biopsy with hydromorphone tissue marker placement. 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 She comes in today to discuss further therapy. Past Medical History  Diagnosis Date  . Hypertension   . Heart attack     per patient was not due to heart disease or blocked arteries.  . Takotsubo cardiomyopathy     Past Surgical History  Procedure Date  . Abdominal hysterectomy 1999  . Cesarean section     x2  . Uterine fibroid surgery     patient not sure of date was before hysterectomy    Family History  Problem Relation Age of Onset  . COPD Mother   . Cancer Father     lung    Social History History  Substance Use Topics  . Smoking status: Former Smoker    Start date: 06/17/1982  . Smokeless tobacco: Never Used  . Alcohol Use: Yes     glass of wine nightly    Allergies  Allergen Reactions  . Lisinopril Cough    Current Outpatient Prescriptions  Medication Sig Dispense Refill  . amLODipine (NORVASC) 5 MG tablet Daily.      . clobetasol cream (TEMOVATE) 0.05 % Daily.      . estradiol (ESTRACE) 1 MG tablet Daily.      . fish oil-omega-3 fatty acids 1000 MG capsule Take 1 g by mouth daily.        Review of Systems Review of Systems  Constitutional: Negative for fever,  chills and unexpected weight change.  HENT: Positive for congestion. Negative for hearing loss, sore throat, trouble swallowing and voice change.   Eyes: Negative for visual disturbance.  Respiratory: Positive for cough. Negative for wheezing.   Cardiovascular: Negative for chest pain, palpitations and leg swelling.  Gastrointestinal: Negative for nausea, vomiting, abdominal pain, diarrhea, constipation, blood in stool, abdominal distention and anal bleeding.  Genitourinary: Negative for hematuria, vaginal bleeding and difficulty urinating.  Musculoskeletal: Negative for arthralgias.  Skin: Negative for rash and wound.  Neurological: Negative for seizures, syncope and headaches.  Hematological: Negative for adenopathy. Does not bruise/bleed easily.  Psychiatric/Behavioral: Negative for confusion.    Blood pressure 134/82, pulse 72, temperature 97.6 F (36.4 C), temperature source Temporal, resp. rate 16, height 5' 4" (1.626 m), weight 143 lb 4 oz (64.978 kg).  Physical Exam Physical Exam  Vitals reviewed. Constitutional: She appears well-developed and well-nourished.  Cardiovascular: Normal rate, regular rhythm and normal heart sounds.   Pulmonary/Chest: Effort normal and breath sounds normal. She has no wheezes. She has no rales. Right breast exhibits no inverted nipple, no mass, no nipple discharge, no skin change and no tenderness. Left breast exhibits no inverted nipple, no mass, no nipple discharge, no skin change and no tenderness. Breasts are symmetrical.  Lymphadenopathy:      She has no cervical adenopathy.    Data Reviewed mmg and path reviewed  Assessment    Right breast mammographic abnormality, core biopsy with csl    Plan    We discussed indication for excisional biopsy this area being to prove that there is not cancer associated with it. I discussed a wire localized excisional biopsy with her. We discussed the risks associated with this including bleeding, infection,  further surgery.We will get her scheduled as soon as possible.       Adewale Pucillo 06/17/2012, 9:53 AM    

## 2012-07-04 ENCOUNTER — Encounter (HOSPITAL_BASED_OUTPATIENT_CLINIC_OR_DEPARTMENT_OTHER): Payer: Self-pay | Admitting: General Surgery

## 2012-07-05 ENCOUNTER — Telehealth (INDEPENDENT_AMBULATORY_CARE_PROVIDER_SITE_OTHER): Payer: Self-pay | Admitting: General Surgery

## 2012-07-05 NOTE — Telephone Encounter (Signed)
Message copied by Littie Deeds on Tue Jul 05, 2012 11:55 AM ------      Message from: [place], [place]      Created: Tue Jul 05, 2012  7:37 AM       Would you please call and tell her this is benign.  No cancer.            ----- Message -----         From: [facility]         Sent: 06/29/2012   3:13 PM           To: Emelia Loron, MD

## 2012-07-05 NOTE — Telephone Encounter (Signed)
Called patient and informed her that her surgical pathology came back benign.

## 2012-07-15 ENCOUNTER — Ambulatory Visit (INDEPENDENT_AMBULATORY_CARE_PROVIDER_SITE_OTHER): Payer: BC Managed Care – PPO | Admitting: General Surgery

## 2012-07-15 ENCOUNTER — Encounter (INDEPENDENT_AMBULATORY_CARE_PROVIDER_SITE_OTHER): Payer: Self-pay | Admitting: General Surgery

## 2012-07-15 VITALS — BP 122/84 | HR 60 | Temp 98.0°F | Resp 16 | Ht 64.0 in | Wt 143.4 lb

## 2012-07-15 DIAGNOSIS — Z09 Encounter for follow-up examination after completed treatment for conditions other than malignant neoplasm: Secondary | ICD-10-CM

## 2012-07-15 NOTE — Patient Instructions (Signed)

## 2012-07-15 NOTE — Progress Notes (Signed)
Subjective:     Patient ID: Janice [name], female   DOB: 1956-01-14, 56 y.o.   MRN: [id number]  HPI This is a 56 year old female who had a complex sclerosing lesion noted on a core biopsy of a left breast mammographic abnormality. I then did a wire localized excision of this area which has been a radial scar with pseudo-angiomatous stromal hyperplasia but no atypia or malignancy identified. She returns today doing well except for she is a little redness around her incision.  Review of Systems     Objective:   Physical Exam Left breast with mild hematoma and there is some redness right around the incision I do not think this is an infection but just a reaction to the Steri-Strips    Assessment:     Status post left breast excisional biopsy    Plan:     We discussed standard screening including yearly mammograms, yearly clinical exams, and monthly self exams. I asked her to come back and see me of her incision is causing her any trouble the next couple of weeks. I did release her to full activity.

## 2012-10-14 ENCOUNTER — Encounter (INDEPENDENT_AMBULATORY_CARE_PROVIDER_SITE_OTHER): Payer: Self-pay

## 2013-07-03 ENCOUNTER — Other Ambulatory Visit: Payer: Self-pay | Admitting: Radiology

## 2013-07-03 ENCOUNTER — Encounter (INDEPENDENT_AMBULATORY_CARE_PROVIDER_SITE_OTHER): Payer: Self-pay

## 2013-07-12 ENCOUNTER — Encounter (INDEPENDENT_AMBULATORY_CARE_PROVIDER_SITE_OTHER): Payer: Self-pay

## 2014-02-01 ENCOUNTER — Encounter (INDEPENDENT_AMBULATORY_CARE_PROVIDER_SITE_OTHER): Payer: Self-pay

## 2016-09-19 DIAGNOSIS — Z1231 Encounter for screening mammogram for malignant neoplasm of breast: Secondary | ICD-10-CM | POA: Diagnosis not present

## 2016-11-20 DIAGNOSIS — H40013 Open angle with borderline findings, low risk, bilateral: Secondary | ICD-10-CM | POA: Diagnosis not present

## 2016-11-20 DIAGNOSIS — H2513 Age-related nuclear cataract, bilateral: Secondary | ICD-10-CM | POA: Diagnosis not present

## 2017-03-10 DIAGNOSIS — Z0000 Encounter for general adult medical examination without abnormal findings: Secondary | ICD-10-CM | POA: Diagnosis not present

## 2017-03-10 DIAGNOSIS — Z23 Encounter for immunization: Secondary | ICD-10-CM | POA: Diagnosis not present

## 2017-03-10 DIAGNOSIS — R7301 Impaired fasting glucose: Secondary | ICD-10-CM | POA: Diagnosis not present

## 2017-03-10 DIAGNOSIS — N951 Menopausal and female climacteric states: Secondary | ICD-10-CM | POA: Diagnosis not present

## 2017-03-10 DIAGNOSIS — I10 Essential (primary) hypertension: Secondary | ICD-10-CM | POA: Diagnosis not present

## 2017-10-02 DIAGNOSIS — Z1231 Encounter for screening mammogram for malignant neoplasm of breast: Secondary | ICD-10-CM | POA: Diagnosis not present

## 2017-12-20 DIAGNOSIS — S83412A Sprain of medial collateral ligament of left knee, initial encounter: Secondary | ICD-10-CM | POA: Diagnosis not present

## 2018-01-14 DIAGNOSIS — M25562 Pain in left knee: Secondary | ICD-10-CM | POA: Diagnosis not present

## 2018-01-25 ENCOUNTER — Ambulatory Visit: Payer: Self-pay | Admitting: Physical Therapy

## 2018-01-26 ENCOUNTER — Ambulatory Visit: Payer: 59 | Attending: Family Medicine | Admitting: Physical Therapy

## 2018-01-26 ENCOUNTER — Encounter: Payer: Self-pay | Admitting: Physical Therapy

## 2018-01-26 ENCOUNTER — Other Ambulatory Visit: Payer: Self-pay

## 2018-01-26 DIAGNOSIS — R262 Difficulty in walking, not elsewhere classified: Secondary | ICD-10-CM | POA: Diagnosis not present

## 2018-01-26 DIAGNOSIS — M25562 Pain in left knee: Secondary | ICD-10-CM | POA: Insufficient documentation

## 2018-01-27 ENCOUNTER — Encounter: Payer: Self-pay | Admitting: Physical Therapy

## 2018-01-27 NOTE — Therapy (Signed)
[facility] [address] [place], [place], [address] Phone: [phone]   Fax:  [phone]  Physical Therapy Evaluation  Patient Details  Name: Janice [name] MRN: [id number] Date of Birth: November 04, 1955 Referring Provider: Dr Selena Batten    Encounter Date: 01/26/2018  PT End of Session - 01/26/18 1623    Visit Number  1    Number of Visits  12    Date for PT Re-Evaluation  03/10/18    PT Start Time  1545    PT Stop Time  1630    PT Time Calculation (min)  45 min    Activity Tolerance  Patient tolerated treatment well    Behavior During Therapy  [facility] for tasks assessed/performed       Past Medical History:  Diagnosis Date  . Heart attack (HCC)    per patient was not due to heart disease or blocked arteries.  . Hypertension   . PONV (postoperative nausea and vomiting)   . Takotsubo cardiomyopathy     Past Surgical History:  Procedure Laterality Date  . ABDOMINAL HYSTERECTOMY  1999  . BREAST BIOPSY  07/01/2012   Procedure: BREAST BIOPSY WITH NEEDLE LOCALIZATION;  Surgeon: Emelia Loron, MD;  Location: [facility];  Service: General;  Laterality: Left;  left breast wire localization biopsy   . CESAREAN SECTION     x2  . UTERINE FIBROID SURGERY     patient not sure of date was before hysterectomy    There were no vitals filed for this visit.   Subjective Assessment - 01/26/18 1555    Subjective  Patient is a 62 year old female with left knee pain. The pain began about 2 months ago when she was walking on a tredmill. She began to have medial knee pain. The pain can godown into her shin at times. She also has posterior pain at times. She has increased pain with steps and when walking for too long. Yesterday she walked from [address] [employer] parking lot into the building nad had pain that reached a 9/10.      Limitations  Standing    How long can you sit comfortably?  No limit    How long can you stand  comfortably?  No limit     How long can you walk comfortably?  difficulty walking long distances     Diagnostic tests  No x-ray in the computer    Patient Stated Goals  to return to activity without pain     Currently in Pain?  Yes    Pain Score  9     Pain Location  Knee    Pain Orientation  Left    Pain Descriptors / Indicators  Aching    Pain Type  Chronic pain    Pain Radiating Towards  can radiate into  the lower leg    Pain Onset  More than a month ago    Pain Frequency  Constant    Aggravating Factors   Standing and walking     Pain Relieving Factors  Rest;     Effect of Pain on Daily Activities  Difficulty walking distances          [facility] PT Assessment - 01/27/18 0001      Assessment   Medical Diagnosis  Left medial knee pain     Referring Provider  Dr Selena Batten     Onset Date/Surgical Date  -- 2 months ago     Hand  Dominance  Right    Next MD Visit  None scheduled     Prior Therapy  None       Precautions   Precautions  None    Precaution Comments  n      Restrictions   Weight Bearing Restrictions  No      Balance Screen   Has the patient fallen in the past 6 months  No    Has the patient had a decrease in activity level because of a fear of falling?   No    Is the patient reluctant to leave their home because of a fear of falling?   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       Coordination   Gross Motor Movements are Fluid and Coordinated  Yes    Fine Motor Movements are Fluid and Coordinated  Yes      Posture/Postural Control   Posture Comments  rounded shoulders       ROM / Strength   AROM / PROM / Strength  AROM;PROM;Strength      AROM   Overall AROM Comments  pain with end range flexion and extension but no pain       Strength   Strength Assessment Site  Knee;Hip    Right/Left Hip  Right;Left    Right Hip Flexion  5/5    Right Hip ABduction  5/5    Right Hip ADduction  5/5    Left Hip Flexion  4/5    Left Hip ABduction  4/5    Left Hip ADduction  4/5      Palpation   Palpation comment  No significant tendenress to palpation       Ambulation/Gait   Gait Comments  decreased single leg stance time on the left. Decreased termianl knee extnesion with gait.                Objective measurements completed on examination: See above findings.      [facility] Adult PT Treatment/Exercise - 01/27/18 0001      Lumbar Exercises: Supine   AB Set Limitations  2x10      Bridge Limitations  2x10     Straight Leg Raises Limitations  2x10               PT Education -  01/27/18 1259    Education provided  Yes    Education Details  reviewed anatomy of contditio, HEP, symptom mangement     Person(s) Educated  Patient    Methods  Explanation;Demonstration;Tactile cues;Handout;Verbal cues    Comprehension  Verbalized understanding;Verbal cues required;Returned demonstration;Tactile cues required;Need further instruction       PT Short Term Goals - 01/27/18 1306      PT SHORT TERM GOAL #1   Title  Patient will report pain free end range active  knee flexion and extension     Time  3    Period  Weeks    Status  New    Target Date  02/17/18      PT SHORT TERM GOAL #2   Title  Patient will increase gross left lower extremity strength to 5/5     Time  3    Period  Weeks    Status  New    Target Date  02/17/18      PT SHORT TERM GOAL #3   Title  Patient will be independent without HEP     Time  3    Period  Weeks    Status  New    Target Date  02/17/18        PT Long Term Goals - 01/27/18 1308      PT LONG TERM GOAL #1   Title  Patient will return to walking on the tredmill at the gym without pain     Time  6    Period  Weeks    Status  New    Target Date  03/10/18      PT LONG TERM GOAL #2   Title  Patient will go up/down 8 steps without pain     Time  6    Period  Weeks    Status  New    Target Date  03/10/18      PT LONG TERM GOAL #3   Title  Patient will demostrate a 41% limitation on FOTO    Time  6    Period  Weeks    Status  New    Target Date  03/10/18             Plan - 01/27/18 1301    Clinical Impression Statement  Patient is a 62 year old female with left knee pain. Signs and symptoms are consistent with a medial meniscus tear. She has a positive appleys compression test. All other knee tests were negative. She has pain with end range extension and flexion. She has pain with increased walking distance. She has decreased terminal knee extension on the left with gait.     Clinical Presentation  Evolving    Clinical Presentation due to:  Pain levels that are increasing over the past two months     Clinical Decision Making  Low    PT Frequency  2x / week    PT Duration  6 weeks    PT Treatment/Interventions  ADLs/Self Care Home Management;Cryotherapy;Electrical Stimulation;Iontophoresis /ml Dexamethasone;Moist Heat;Ultrasound;Therapeutic exercise;Therapeutic activities;Gait training;Stair training;Neuromuscular re-education;Patient/family education;Manual techniques;Passive range of  motion;Dry needling;Taping    PT Next Visit Plan  add clamm shells; single leg stance; standing three way hip; heel raises; consider usltrasound and Ionto. Limited HEP given this visit 2nd to the knee being reactive     PT Home Exercise Plan  quad set; SLR; bridge     Consulted and Agree with Plan of Care  Patient  Patient will benefit from skilled therapeutic intervention in order to improve the following deficits and impairments:  Abnormal gait, Pain, Decreased activity tolerance, Decreased range of motion, Decreased strength, Difficulty walking  Visit Diagnosis: Acute pain of left knee  Difficulty in walking, not elsewhere classified     Problem List There are no active problems to display for this patient.   Dessie Coma PT DPT  01/27/2018, 1:27 PM  [facility] [address] [place], [place], [address] Phone: [phone]   Fax:  [phone]  Name: Janice [name] MRN: [id number] Date of Birth: February 13, 1956

## 2018-02-01 ENCOUNTER — Ambulatory Visit: Payer: Self-pay | Admitting: Physical Therapy

## 2018-02-03 ENCOUNTER — Ambulatory Visit: Payer: Self-pay

## 2018-02-10 ENCOUNTER — Encounter: Payer: Self-pay | Admitting: Physical Therapy

## 2018-02-10 ENCOUNTER — Ambulatory Visit: Payer: 59 | Admitting: Physical Therapy

## 2018-02-10 DIAGNOSIS — R262 Difficulty in walking, not elsewhere classified: Secondary | ICD-10-CM

## 2018-02-10 DIAGNOSIS — M25562 Pain in left knee: Secondary | ICD-10-CM

## 2018-02-10 NOTE — Therapy (Signed)
[facility] [address] [place], [place], [address] Phone: [phone]   Fax:  [phone]  Physical Therapy Treatment  Patient Details  Name: Janice [name] MRN: [id number] Date of Birth: 1955/10/16 Referring Provider: Dr Selena Batten    Encounter Date: 02/10/2018  PT End of Session - 02/10/18 1328    Visit Number  2    Number of Visits  12    Date for PT Re-Evaluation  03/10/18    PT Start Time  1235    PT Stop Time  1325    PT Time Calculation (min)  50 min    Activity Tolerance  Patient tolerated treatment well    Behavior During Therapy  [facility] for tasks assessed/performed       Past Medical History:  Diagnosis Date  . Heart attack (HCC)    per patient was not due to heart disease or blocked arteries.  . Hypertension   . PONV (postoperative nausea and vomiting)   . Takotsubo cardiomyopathy     Past Surgical History:  Procedure Laterality Date  . ABDOMINAL HYSTERECTOMY  1999  . BREAST BIOPSY  07/01/2012   Procedure: BREAST BIOPSY WITH NEEDLE LOCALIZATION;  Surgeon: Emelia Loron, MD;  Location: [facility];  Service: General;  Laterality: Left;  left breast wire localization biopsy   . CESAREAN SECTION     x2  . UTERINE FIBROID SURGERY     patient not sure of date was before hysterectomy    There were no vitals filed for this visit.  Subjective Assessment - 02/10/18 1238    Subjective  Pt arriving to therapy today reporting 1/10 pain in her left knee. Pt believes the exercises have helped that were issued the last visit. Pt reporting finally being able to walk to her car without stopping due to pain.     Limitations  Standing    How long can you sit comfortably?  No limit    How long can you stand comfortably?  No limit     How long can you walk comfortably?  difficulty walking long distances     Diagnostic tests  No x-ray in the computer    Patient Stated Goals  to return to activity  without pain     Currently in Pain?  Yes    Pain Score  1     Pain Location  Knee    Pain Orientation  Left    Pain Onset  More than a month ago    Pain Frequency  Constant    Multiple Pain Sites  No                       [facility] Adult PT Treatment/Exercise - 02/10/18 0001      Exercises   Exercises  Knee/Hip      Lumbar Exercises: Stretches   Active Hamstring Stretch  --      Lumbar Exercises: Supine   Clam  10 reps;5 seconds;Limitations    Clam Limitations  green theraband    Bridge Limitations  2x 15 with feet on ball    Straight Leg Raise  15 reps;Limitations    Straight Leg Raises Limitations  2 sets      Knee/Hip Exercises: Stretches   Active Hamstring Stretch  3 reps;30 seconds    Gastroc Stretch  Left;3 reps;20 seconds      Knee/Hip Exercises: Aerobic   Recumbent Bike  L2 x 5 minutes  Knee/Hip Exercises: Standing   Heel Raises  10 reps    Hip Abduction  10 reps    Hip Extension  10 reps    Functional Squat  5 reps    Wall Squat  5 reps    Other Standing Knee Exercises  single leg stance on blue theraband disc x 10 seconds max on L LE.       Knee/Hip Exercises: Seated   Clamshell with TheraBand  Green 15 reps      Knee/Hip Exercises: Supine   Short Arc Quad Sets  15 reps;Limitations    Short Arc Quad Sets Limitations  knee on ball    Bridges  10 reps    Bridges Limitations  feet on ball      Modalities   Modalities  Cryotherapy      Cryotherapy   Number Minutes Cryotherapy  8 Minutes    Cryotherapy Location  Knee    Type of Cryotherapy  Ice pack             PT Education - 02/10/18 1340    Education provided  Yes    Education Details  reviewed pt's HEP and added new execises    Person(s) Educated  Patient    Methods  Explanation;Demonstration;Handout;Verbal cues    Comprehension  Verbalized understanding;Returned demonstration       PT Short Term Goals - 02/10/18 1332      PT SHORT TERM GOAL #1   Title  Patient will  report pain free end range active knee flexion and extension     Time  3    Period  Weeks    Status  New      PT SHORT TERM GOAL #2   Title  Patient will increase gross left lower extremity strength to 5/5     Time  3    Period  Weeks    Status  New        PT Long Term Goals - 02/10/18 1331      PT LONG TERM GOAL #1   Title  Patient will return to walking on the tredmill at the gym without pain     Time  6    Period  Weeks    Status  New      PT LONG TERM GOAL #2   Title  Patient will go up/down 8 steps without pain     Time  6    Period  Weeks    Status  New      PT LONG TERM GOAL #3   Title  Patient will demostrate a 41% limitation on FOTO    Time  6    Period  Weeks            Plan - 02/10/18 1328    Clinical Impression Statement  Pt arriving to therapy reporting the exercises have been helping. Pt reporting being able to amb without stopping for pain. We reviewed pt's HEP and added standing hip exercises and clams. Pt issued a green theraband. Continuing skilled PT.     PT Frequency  2x / week    PT Duration  6 weeks    PT Treatment/Interventions  ADLs/Self Care Home Management;Cryotherapy;Electrical Stimulation;Iontophoresis /ml Dexamethasone;Moist Heat;Ultrasound;Therapeutic exercise;Therapeutic activities;Gait training;Stair training;Neuromuscular re-education;Patient/family education;Manual techniques;Passive range of motion;Dry needling;Taping    PT Next Visit Plan  add clamm shells; single leg stance; standing three way hip; heel raises; consider usltrasound and Ionto. Limited HEP given this visit 2nd to the  knee being reactive     PT Home Exercise Plan  quad set; SLR; bridge     Consulted and Agree with Plan of Care  Patient       Patient will benefit from skilled therapeutic intervention in order to improve the following deficits and impairments:  Abnormal gait, Pain, Decreased activity tolerance, Decreased range of motion, Decreased strength,  Difficulty walking  Visit Diagnosis: Acute pain of left knee  Difficulty in walking, not elsewhere classified     Problem List There are no active problems to display for this patient.   Sharmon Leyden, MPT 02/10/2018, 1:40 PM  [facility] [address] [place], [place], [address] Phone: [phone]   Fax:  [phone]  Name: Janice [name] MRN: [id number] Date of Birth: September 06, 1956

## 2018-02-11 ENCOUNTER — Encounter

## 2018-02-14 ENCOUNTER — Ambulatory Visit: Payer: 59

## 2018-02-15 ENCOUNTER — Ambulatory Visit: Payer: 59 | Attending: Family Medicine

## 2018-02-15 DIAGNOSIS — R262 Difficulty in walking, not elsewhere classified: Secondary | ICD-10-CM | POA: Insufficient documentation

## 2018-02-15 DIAGNOSIS — M25562 Pain in left knee: Secondary | ICD-10-CM | POA: Diagnosis not present

## 2018-02-15 NOTE — Therapy (Signed)
[place], [place], [address] Phone: [phone]   Fax:  [phone]  Physical Therapy Treatment  Patient Details  Name: Janice [name] MRN: [id number] Date of Birth: 1956/07/25 Referring Provider: Dr Theadore Nan    Encounter Date: 02/15/2018  PT End of Session - 02/15/18 0707    Visit Number  3    Number of Visits  12    Date for PT Re-Evaluation  03/10/18    PT Start Time  0705    PT Stop Time  0753    PT Time Calculation (min)  48 min    Activity Tolerance  Patient tolerated treatment well    Behavior During Therapy  [facility] for tasks assessed/performed       Past Medical History:  Diagnosis Date  . Heart attack ([place])    per patient was not due to heart disease or blocked arteries.  . Hypertension   . PONV (postoperative nausea and vomiting)   . Takotsubo cardiomyopathy     Past Surgical History:  Procedure Laterality Date  . ABDOMINAL HYSTERECTOMY  1999  . BREAST BIOPSY  07/01/2012   Procedure: BREAST BIOPSY WITH NEEDLE LOCALIZATION;  Surgeon: Rolm Bookbinder, MD;  Location: [place];  Service: General;  Laterality: Left;  left breast wire localization biopsy   . CESAREAN SECTION     x2  . UTERINE FIBROID SURGERY     patient not sure of date was before hysterectomy    There were no vitals filed for this visit.  Subjective Assessment - 02/15/18 0708    Subjective  Medial knee pain to start , now pain in knee Lt but not like it was.     Pain Score  1     Pain Location  Knee    Pain Orientation  Left    Pain Descriptors / Indicators  Aching    Pain Type  Chronic pain    Pain Onset  More than a month ago    Pain Frequency  Intermittent    Aggravating Factors   weight bearing    Pain Relieving Factors  rest.                       [facility] Adult PT Treatment/Exercise - 02/15/18 0001      Knee/Hip Exercises: Aerobic   Recumbent Bike  L2 x 6 minutes       Knee/Hip Exercises: Standing   Hip Abduction  10 reps;Right;Left    Abduction Limitations  green band at knees    Hip Extension  Right;Left;10 reps    Extension Limitations  green band at knees    Wall Squat  5 reps;5 seconds cued to keep bend to < 90 degrees     Other Standing Knee Exercises  sngle leg hip hinge x 5 RT /Lt cued for technique      Knee/Hip Exercises: Seated   Clamshell with Marga Hoots             PT Education - 02/15/18 0753    Education provided  Yes    Education Details  hip hinge, treadmill progression    Person(s) Educated  Patient    Methods  Explanation;Demonstration;Tactile cues;Verbal cues;Handout    Comprehension  Verbalized understanding;Returned demonstration       PT Short Term Goals - 02/15/18 0756      PT SHORT TERM GOAL #1   Title  Patient will report pain free  end range active knee flexion and extension     Baseline  1/10    Status  Partially Met      PT SHORT TERM GOAL #2   Title  Patient will increase gross left lower extremity strength to 5/5     Status  Unable to assess        PT Long Term Goals - 02/10/18 1331      PT LONG TERM GOAL #1   Title  Patient will return to walking on the tredmill at the gym without pain     Time  6    Period  Weeks    Status  New      PT LONG TERM GOAL #2   Title  Patient will go up/down 8 steps without pain     Time  6    Period  Weeks    Status  New      PT LONG TERM GOAL #3   Title  Patient will demostrate a 41% limitation on FOTO    Time  6    Period  Weeks            Plan - 02/15/18 0755    Clinical Impression Statement  nO INCCREASED PAIN AND KNEE FEELS LOOSER AFTER SESSION . Marland Kitchen Will progress TM and strength as tolerated    PT Treatment/Interventions  ADLs/Self Care Home Management;Cryotherapy;Electrical Stimulation;Iontophoresis 76m/ml Dexamethasone;Moist Heat;Ultrasound;Therapeutic exercise;Therapeutic activities;Gait training;Stair training;Neuromuscular  re-education;Patient/family education;Manual techniques;Passive range of motion;Dry needling;Taping    PT Home Exercise Plan  quad set; SLR; bridge , 02/15/18 single leg hip hinge and dead lift for glute strength    Consulted and Agree with Plan of Care  Patient       Patient will benefit from skilled therapeutic intervention in order to improve the following deficits and impairments:  Abnormal gait, Pain, Decreased activity tolerance, Decreased range of motion, Decreased strength, Difficulty walking  Visit Diagnosis: Acute pain of left knee  Difficulty in walking, not elsewhere classified     Problem List There are no active problems to display for this patient.   CDarrel Hoover PT 02/15/2018, 7:57 AM  [facility] [address]Phone: [phone]  Fax:  [phone] Name: Janice SPRINGSTONMRN: [id number] of Birth: 1October 29, 1957

## 2018-02-15 NOTE — Patient Instructions (Addendum)
Hip Extension: Hamstring Single Leg Deadlift (Eccentric)   Holding weights, stand on affected leg with knee slightly flexed. Lift other leg while slowly bending forward at the hip. Use ___ lb weight. ___ reps per set, ___ sets per day, ___ days per week. Add ___ lbs when you achieve ___ repetitions. Touch floor with weight.  Copyright  VHI. All rights reserved.  Squat: Wide Leg   Feet wide apart, toes out, squat, holding weight between knees. Weight should be at ankles.  Bend at the hips with hips going back behind hips. Keep back straight.  Put eight on stool if you cannot get to floor.  Repeat _3-15___ times per set. Do ___1-2_ sets per session. Do __2-5__ sessions per week. Use __0-10__ lb weight.  Copyright  VHI. All rights reserved.  Squat: Half   Arms hanging at sides, squat by dropping hips back as if sitting on a chair. Keep knees over ankles, hips behind heels.  Keep back straight.  Bend at hips and knees will bend with hips.   Repeat ___3-15_ times per set. Do __1-2__ sets per session. Do __2-5__ sessions per week. Use __0-10HIP / KNEE: Flexion / Extension, Squat Unilateral Hip / Glute Extension: Standing - Straight Leg (Machine) Hip Extension (Standing) Healthy Back - Yoga Tree Balance TREADMILL PROGRESSION OF TIME 5-10 MIN , SPEED 1.715mph TO START THEN PROGRESS AFTER 3-4 SESSIONS TO INCR TIME OR SPEED WORKING TO 30 MIN

## 2018-02-21 ENCOUNTER — Encounter: Payer: Self-pay | Admitting: Physical Therapy

## 2018-02-21 ENCOUNTER — Ambulatory Visit: Payer: 59 | Admitting: Physical Therapy

## 2018-02-21 DIAGNOSIS — M25562 Pain in left knee: Secondary | ICD-10-CM | POA: Diagnosis not present

## 2018-02-21 DIAGNOSIS — R262 Difficulty in walking, not elsewhere classified: Secondary | ICD-10-CM

## 2018-02-21 NOTE — Therapy (Signed)
[facility] [address] [place], [place], [address] Phone: [phone]   Fax:  [phone]  Physical Therapy Treatment  Patient Details  Name: Janice [name] MRN: [id number] Date of Birth: Jul 05, 1956 Referring Provider: Dr Selena Batten    Encounter Date: 02/21/2018  PT End of Session - 02/21/18 1206    Visit Number  4    Number of Visits  12    Date for PT Re-Evaluation  03/10/18    PT Start Time  1148    PT Stop Time  1230    PT Time Calculation (min)  42 min    Activity Tolerance  Patient tolerated treatment well    Behavior During Therapy  [facility] for tasks assessed/performed       Past Medical History:  Diagnosis Date  . Heart attack ([facility])    per patient was not due to heart disease or blocked arteries.  . Hypertension   . PONV (postoperative nausea and vomiting)   . Takotsubo cardiomyopathy     Past Surgical History:  Procedure Laterality Date  . ABDOMINAL HYSTERECTOMY  1999  . BREAST BIOPSY  07/01/2012   Procedure: BREAST BIOPSY WITH NEEDLE LOCALIZATION;  Surgeon: Emelia Loron, MD;  [facility];  Service: General;  Laterality: Left;  left breast wire localization biopsy   . CESAREAN SECTION     x2  . UTERINE FIBROID SURGERY     patient not sure of date was before hysterectomy    There were no vitals filed for this visit.  Subjective Assessment - 02/21/18 1157    Subjective  Patients medial knee pain has improved. She is now having minor pain around her patella area.     Limitations  Standing    How long can you sit comfortably?  No limit    How long can you stand comfortably?  No limit     How long can you walk comfortably?  difficulty walking long distances     Diagnostic tests  No x-ray in the computer    Patient Stated Goals  to return to activity without pain     Currently in Pain?  No/denies                       [facility] Adult PT Treatment/Exercise -  02/21/18 0001      Lumbar Exercises: Stretches   Passive Hamstring Stretch  --    Passive Hamstring Stretch Limitations  --    Other Lumbar Stretch Exercise  --      Lumbar Exercises: Supine   Clam  10 reps;5 seconds;Limitations    Clam Limitations  green theraband    Bridge Limitations  2x 15 with feet on ball    Straight Leg Raise  15 reps;Limitations    Straight Leg Raises Limitations  2 sets      Knee/Hip Exercises: Stretches   Active Hamstring Stretch  3 reps;30 seconds;Limitations    Active Hamstring Stretch Limitations  reviewed supine and seated     Gastroc Stretch  Left;3 reps;20 seconds;Limitations    Gastroc Stretch Limitations  standing     Other Knee/Hip Stretches  thomas stretch 3x20 sec hold       Knee/Hip Exercises: Aerobic   Recumbent Bike  L4x 5 minutes      Knee/Hip Exercises: Standing   Hip Abduction  10 reps;Right;Left;2 sets    Abduction Limitations  --    Hip Extension  Right;Left;10 reps;2 sets  Functional Squat  Limitations    Functional Squat Limitations  reviewed hip hinge 1x10 and x5       Modalities   Modalities  -- declined              PT Education - 02/21/18 1205    Education provided  Yes    Education Details  reviewed exercises     Person(s) Educated  Patient    Methods  Explanation;Demonstration;Tactile cues;Verbal cues    Comprehension  Verbalized understanding;Returned demonstration;Tactile cues required;Verbal cues required;Need further instruction       PT Short Term Goals - 02/21/18 1418      PT SHORT TERM GOAL #1   Title  Patient will report pain free end range active knee flexion and extension     Baseline  1/10    Time  3    Period  Weeks    Status  On-going      PT SHORT TERM GOAL #2   Title  Patient will increase gross left lower extremity strength to 5/5     Time  3    Period  Weeks    Status  On-going      PT SHORT TERM GOAL #3   Title  Patient will be independent without HEP     Time  3    Period   Weeks    Status  On-going        PT Long Term Goals - 02/10/18 1331      PT LONG TERM GOAL #1   Title  Patient will return to walking on the tredmill at the gym without pain     Time  6    Period  Weeks    Status  New      PT LONG TERM GOAL #2   Title  Patient will go up/down 8 steps without pain     Time  6    Period  Weeks    Status  New      PT LONG TERM GOAL #3   Title  Patient will demostrate a 41% limitation on FOTO    Time  6    Period  Weeks            Plan - 02/21/18 1414    Clinical Impression Statement  Patient reported improved tightness compared to when the patient came in. She was given stretches to work on at home and at work. She is making good progress. She requirewd mod cuing for proper hip hinge    Clinical Presentation  Evolving    Clinical Decision Making  Low    Rehab Potential  Good    PT Frequency  2x / week    PT Duration  6 weeks    PT Treatment/Interventions  ADLs/Self Care Home Management;Cryotherapy;Electrical Stimulation;Iontophoresis 4mg /ml Dexamethasone;Moist Heat;Ultrasound;Therapeutic exercise;Therapeutic activities;Gait training;Stair training;Neuromuscular re-education;Patient/family education;Manual techniques;Passive range of motion;Dry needling;Taping    PT Next Visit Plan  add clamm shells; single leg stance; standing three way hip; heel raises; consider usltrasound and Ionto. continue to progress exercises. May D/C if she is feeling better.     PT Home Exercise Plan  quad set; SLR; bridge , 02/15/18 single leg hip hinge and dead lift for glute strength       Patient will benefit from skilled therapeutic intervention in order to improve the following deficits and impairments:  Abnormal gait, Pain, Decreased activity tolerance, Decreased range of motion, Decreased strength, Difficulty walking  Visit Diagnosis: Acute pain of  left knee  Difficulty in walking, not elsewhere classified     Problem List There are no active  problems to display for this patient.   Dessie Comaavid J Prabhjot Piscitello PT DPT  02/21/2018, 2:19 PM  [facility] [address] [place], [place], [address] Phone: [phone]   Fax:  [phone]  Name: Janice [name] MRN: [id number] Date of Birth: December 13, 1955

## 2018-02-23 ENCOUNTER — Encounter: Payer: 59 | Admitting: Physical Therapy

## 2018-02-28 ENCOUNTER — Ambulatory Visit: Payer: 59

## 2018-03-07 ENCOUNTER — Ambulatory Visit: Payer: 59

## 2018-03-10 ENCOUNTER — Ambulatory Visit: Payer: 59 | Admitting: Physical Therapy

## 2018-03-10 ENCOUNTER — Encounter: Payer: Self-pay | Admitting: Physical Therapy

## 2018-03-10 DIAGNOSIS — M25562 Pain in left knee: Secondary | ICD-10-CM | POA: Diagnosis not present

## 2018-03-10 DIAGNOSIS — R262 Difficulty in walking, not elsewhere classified: Secondary | ICD-10-CM

## 2018-03-10 NOTE — Therapy (Signed)
[place], [place], [address] Phone: [phone]   Fax:  [phone]  Physical Therapy Treatment/Discharge  Patient Details  Name: Janice [name] MRN: [id number] Date of Birth: 07-26-1956 Referring Provider: Dr Theadore Nan    Encounter Date: 03/10/2018  PT End of Session - 03/10/18 2111    Visit Number  5    Number of Visits  12    Date for PT Re-Evaluation  03/10/18    PT Start Time  1150    PT Stop Time  1230    PT Time Calculation (min)  40 min    Activity Tolerance  Patient tolerated treatment well    Behavior During Therapy  [facility] for tasks assessed/performed       Past Medical History:  Diagnosis Date  . Heart attack ([place])    per patient was not due to heart disease or blocked arteries.  . Hypertension   . PONV (postoperative nausea and vomiting)   . Takotsubo cardiomyopathy     Past Surgical History:  Procedure Laterality Date  . ABDOMINAL HYSTERECTOMY  1999  . BREAST BIOPSY  07/01/2012   Procedure: BREAST BIOPSY WITH NEEDLE LOCALIZATION;  Surgeon: Rolm Bookbinder, MD;  Location: [place];  Service: General;  Laterality: Left;  left breast wire localization biopsy   . CESAREAN SECTION     x2  . UTERINE FIBROID SURGERY     patient not sure of date was before hysterectomy    There were no vitals filed for this visit.  Subjective Assessment - 03/10/18 2051    Subjective  Patients pain around her patella has improved significantly. She is now having posterior knee pain that is traveling down into her medial and lateral gastroc. She descirbes the pain as a burning.   (Pended)     Limitations  Standing  (Pended)     How long can you sit comfortably?  No limit  (Pended)     How long can you stand comfortably?  No limit   (Pended)     How long can you walk comfortably?  difficulty walking long distances   (Pended)     Diagnostic tests  No x-ray in the computer  (Pended)      Patient Stated Goals  to return to activity without pain   (Pended)     Currently in Pain?  Yes  (Pended)     Pain Score  3   (Pended)     Pain Location  Calf  (Pended)     Pain Orientation  Left  (Pended)     Pain Descriptors / Indicators  Burning  (Pended)     Pain Type  Chronic pain  (Pended)     Pain Radiating Towards  radiates into her lower leg   (Pended)     Pain Onset  More than a month ago  (Pended)     Pain Frequency  Intermittent  (Pended)     Aggravating Factors   weight bearing  (Pended)     Pain Relieving Factors  rest   (Pended)     Effect of Pain on Daily Activities  difficulty walking distances   (Pended)                        [facility] Adult PT Treatment/Exercise - 03/10/18 0001      Self-Care   Self-Care  Other Self-Care Comments    Other Self-Care Comments  reviewed self management of her exercises and exercise progresion.       Lumbar Exercises: Stretches   Passive Hamstring Stretch Limitations  3x20 sec with strap. Also reviewed a seated stretch     Gastroc Stretch Limitations  3x20 sec left also reviewed standing      Knee/Hip Exercises: Standing   Other Standing Knee Exercises  lateral  band walk 3x5 each way; reviewed single leg stance for home.     Other Standing Knee Exercises  Reviewed final HEP exercises. Good technique. patient independnet with HEP              PT Education - 03/10/18 2109    Education provided  Yes    Education Details  reviewed stretches for gastroc and final HEP     Person(s) Educated  Patient    Methods  Explanation;Demonstration;Tactile cues;Verbal cues    Comprehension  Verbalized understanding;Returned demonstration;Verbal cues required;Tactile cues required       PT Short Term Goals - 03/10/18 2114      PT SHORT TERM GOAL #1   Title  Patient will report pain free end range active knee flexion and extension     Baseline  no pain but some pain in the calf with flexion     Time  3    Period  Weeks     Status  Achieved      PT SHORT TERM GOAL #2   Title  Patient will increase gross left lower extremity strength to 5/5     Baseline  slight difference between left and right LE     Time  3    Period  Weeks    Status  Achieved      PT SHORT TERM GOAL #3   Title  Patient will be independent without HEP     Time  3    Period  Weeks    Status  Achieved        PT Long Term Goals - 03/10/18 2116      PT LONG TERM GOAL #1   Title  Patient will return to walking on the tredmill at the gym without pain     Baseline  walking outside without initial knee pain     Time  6    Period  Weeks    Status  Achieved      PT LONG TERM GOAL #2   Title  Patient will go up/down 8 steps without pain     Baseline  no pain with stairs     Time  6    Period  Weeks    Status  Achieved      PT LONG TERM GOAL #3   Title  Patient will demostrate a 41% limitation on FOTO    Baseline  45% limitation but met in much fewer visits    Time  6    Period  Weeks    Status  Not Met            Plan - 03/10/18 2112    Clinical Impression Statement  The inital problem thatthe patient came for has resolved. She is now having a burning in her calf. She does not remeber straining her calf. She does not have tenderness to palpation or to stretching. She was given calf stretches but advised to go back to her MD if the problem does not resolve. She is independent with her exercises. D/C t this time. Depsite her new onsewtof calf pain  dshe has reached all goals for therapy. See below for goal specific progress.     Clinical Presentation  Evolving    Clinical Decision Making  Low    Rehab Potential  Good    PT Frequency  2x / week    PT Duration  6 weeks    PT Treatment/Interventions  ADLs/Self Care Home Management;Cryotherapy;Electrical Stimulation;Iontophoresis 51m/ml Dexamethasone;Moist Heat;Ultrasound;Therapeutic exercise;Therapeutic activities;Gait training;Stair training;Neuromuscular  re-education;Patient/family education;Manual techniques;Passive range of motion;Dry needling;Taping    PT Next Visit Plan  add clamm shells; single leg stance; standing three way hip; heel raises; consider usltrasound and Ionto. continue to progress exercises. May D/C if she is feeling better.     PT Home Exercise Plan  quad set; SLR; bridge , 02/15/18 single leg hip hinge and dead lift for glute strength    Consulted and Agree with Plan of Care  Patient       Patient will benefit from skilled therapeutic intervention in order to improve the following deficits and impairments:  Abnormal gait, Pain, Decreased activity tolerance, Decreased range of motion, Decreased strength, Difficulty walking  Visit Diagnosis: Acute pain of left knee  Difficulty in walking, not elsewhere classified  PHYSICAL THERAPY DISCHARGE SUMMARY  Visits from Start of Care: 5  Current functional level related to goals / functional outcomes: Improved anterior knee pain    Remaining deficits: Burning into bilateral calfs    Education / Equipment: HEP   Plan: Patient agrees to discharge.  Patient goals were met. Patient is being discharged due to meeting the stated rehab goals.  ?????       Problem List There are no active problems to display for this patient.   DCarney LivingPT DPT  03/10/2018, 9:21 PM  [facility] [address]Phone: [phone]  Fax:  [phone] Name: Janice RYTHERMRN: [id number] of Birth: [birth year]

## 2018-03-31 DIAGNOSIS — R748 Abnormal levels of other serum enzymes: Secondary | ICD-10-CM | POA: Diagnosis not present

## 2018-03-31 DIAGNOSIS — E782 Mixed hyperlipidemia: Secondary | ICD-10-CM | POA: Diagnosis not present

## 2018-03-31 DIAGNOSIS — R7301 Impaired fasting glucose: Secondary | ICD-10-CM | POA: Diagnosis not present

## 2018-03-31 DIAGNOSIS — I10 Essential (primary) hypertension: Secondary | ICD-10-CM | POA: Diagnosis not present

## 2018-03-31 DIAGNOSIS — M25562 Pain in left knee: Secondary | ICD-10-CM | POA: Diagnosis not present

## 2018-03-31 DIAGNOSIS — Z0000 Encounter for general adult medical examination without abnormal findings: Secondary | ICD-10-CM | POA: Diagnosis not present

## 2018-05-09 DIAGNOSIS — M25562 Pain in left knee: Secondary | ICD-10-CM | POA: Diagnosis not present

## 2018-06-17 DIAGNOSIS — R300 Dysuria: Secondary | ICD-10-CM | POA: Diagnosis not present

## 2018-06-17 DIAGNOSIS — R319 Hematuria, unspecified: Secondary | ICD-10-CM | POA: Diagnosis not present

## 2018-06-17 DIAGNOSIS — R1031 Right lower quadrant pain: Secondary | ICD-10-CM | POA: Diagnosis not present

## 2018-07-22 DIAGNOSIS — R55 Syncope and collapse: Secondary | ICD-10-CM | POA: Diagnosis not present

## 2018-07-22 DIAGNOSIS — I10 Essential (primary) hypertension: Secondary | ICD-10-CM | POA: Diagnosis not present

## 2018-07-22 DIAGNOSIS — Z888 Allergy status to other drugs, medicaments and biological substances status: Secondary | ICD-10-CM | POA: Diagnosis not present

## 2018-07-22 DIAGNOSIS — K5900 Constipation, unspecified: Secondary | ICD-10-CM | POA: Diagnosis not present

## 2018-07-23 DIAGNOSIS — R55 Syncope and collapse: Secondary | ICD-10-CM | POA: Diagnosis not present

## 2018-07-25 DIAGNOSIS — R319 Hematuria, unspecified: Secondary | ICD-10-CM | POA: Diagnosis not present

## 2018-07-25 DIAGNOSIS — I10 Essential (primary) hypertension: Secondary | ICD-10-CM | POA: Diagnosis not present

## 2018-07-25 DIAGNOSIS — R55 Syncope and collapse: Secondary | ICD-10-CM | POA: Diagnosis not present

## 2018-09-19 DIAGNOSIS — R3121 Asymptomatic microscopic hematuria: Secondary | ICD-10-CM | POA: Diagnosis not present
